# Patient Record
Sex: Male | Born: 1960 | Race: Black or African American | Hispanic: No | Marital: Single | Smoking: Never smoker
Health system: Southern US, Community
[De-identification: ages and names within clinical notes are randomized; demographics above are authoritative.]

## PROBLEM LIST (undated history)

## (undated) DIAGNOSIS — R450 Nervousness: Secondary | ICD-10-CM

## (undated) DIAGNOSIS — I10 Essential (primary) hypertension: Secondary | ICD-10-CM

## (undated) DIAGNOSIS — R569 Unspecified convulsions: Secondary | ICD-10-CM

## (undated) HISTORY — DX: Unspecified convulsions: R56.9

## (undated) HISTORY — DX: Nervousness: R45.0

## (undated) HISTORY — PX: TOE SURGERY: SHX1073

## (undated) HISTORY — DX: Essential (primary) hypertension: I10

---

## 2013-11-07 ENCOUNTER — Ambulatory Visit (INDEPENDENT_AMBULATORY_CARE_PROVIDER_SITE_OTHER): Payer: Medicare Other | Admitting: Diagnostic Neuroimaging

## 2013-11-07 ENCOUNTER — Encounter: Payer: Self-pay | Admitting: Diagnostic Neuroimaging

## 2013-11-07 VITALS — BP 100/62 | HR 69 | Ht 74.0 in | Wt 204.0 lb

## 2013-11-07 DIAGNOSIS — G40309 Generalized idiopathic epilepsy and epileptic syndromes, not intractable, without status epilepticus: Secondary | ICD-10-CM

## 2013-11-07 DIAGNOSIS — G40909 Epilepsy, unspecified, not intractable, without status epilepticus: Secondary | ICD-10-CM

## 2013-11-07 DIAGNOSIS — M542 Cervicalgia: Secondary | ICD-10-CM

## 2013-11-07 DIAGNOSIS — M5412 Radiculopathy, cervical region: Secondary | ICD-10-CM

## 2013-11-07 NOTE — Progress Notes (Signed)
[facility]  PATIENT: Frank Newton DOB: November 11, 1960  REFERRING CLINICIAN: Williams  HISTORY FROM: patient  REASON FOR VISIT: new consult   HISTORICAL  CHIEF COMPLAINT:  No chief complaint on file.   HISTORY OF PRESENT ILLNESS:   53 year old right-handed male here for evaluation of seizure disorder.  Age 53 years old, patient developed staring spells. These were not officially diagnosed. Age 53 years old patient began to drink alcohol significantly. Age 53 years old patient had first episode of complete loss of consciousness. Patient was taken to the hospital and diagnosed with seizure disorder. He had MRI, CT, EEG apparently which were unremarkable. He was started on levetiracetam, but had to stop because of rash and increased seizures. Then patient started on Depakote. Then Dilantin was added. Patient continued to have intermittent staring spells at least once per week. He also continued to use alcohol, and was variably compliant with medication.  2 months ago patient had another grand mal seizure, in the setting of stopping antiseizure medication and using alcohol. Since that time patient has stopped alcohol altogether and now is taking medication regularly. No further seizures.  Significant family history seizure and his sister, another sister's son, and maternal grandfather.   Also having some neck pain, radiating to the left shoulder, for past 2 yrs.    REVIEW OF SYSTEMS: Full 14 system review of systems performed and notable only for  rash itching weight loss and anemia.   ALLERGIES: Allergies  Allergen Reactions  . Hydrochlorothiazide   . Keppra [Levetiracetam]   . Zofran [Ondansetron Hcl]     HOME MEDICATIONS: No outpatient prescriptions prior to visit.   No facility-administered medications prior to visit.    PAST MEDICAL HISTORY: Past Medical History  Diagnosis Date  . Seizure   . Hypertension     PAST SURGICAL HISTORY: Past Surgical  History  Procedure Laterality Date  . Toe surgery      3 toes removed    FAMILY HISTORY: Family History  Problem Relation Age of Onset  . Cancer Father     SOCIAL HISTORY:  History   Social History  . Marital Status: Single    Spouse Name: N/A    Number of Children: N/A  . Years of Education: College   Occupational History  .  Other    disability   Social History Main Topics  . Smoking status: Never Smoker   . Smokeless tobacco: Never Used  . Alcohol Use: No     Comment: 09/22/13  . Drug Use: No  . Sexual Activity: Not on file   Other Topics Concern  . Not on file   Social History Narrative   Patient lives at home with friends.   Caffeine Use: sometimes     PHYSICAL EXAM  Filed Vitals:   11/07/13 1427  BP: 100/62  Pulse: 69  Height:  (1.88 m)  Weight: 204 lb (92.534 kg)    Not recorded    Body mass index is 26.18 kg/(m^2).  GENERAL EXAM: Patient is in no distress; well developed, nourished and groomed; neck is supple; HIVES ON RIGHT FACE, LEFT NECK, HANDS, ARMS  CARDIOVASCULAR: Regular rate and rhythm, no murmurs, no carotid bruits  NEUROLOGIC: MENTAL STATUS: awake, alert, oriented to person, place and time, recent and remote memory intact, normal attention and concentration, language fluent, comprehension intact, naming intact, fund of knowledge appropriate CRANIAL NERVE: no papilledema on fundoscopic exam, pupils equal and reactive to light, visual fields full to confrontation,  extraocular muscles intact, no nystagmus, facial sensation and strength symmetric, hearing intact, palate elevates symmetrically, uvula midline, shoulder shrug symmetric, tongue midline. MOTOR: normal bulk and tone, full strength in the BUE, BLE SENSORY: normal and symmetric to light touch, pinprick, temperature, vibration COORDINATION: finger-nose-finger, fine finger movements normal REFLEXES: deep tendon reflexes present and symmetric GAIT/STATION: narrow based  gait; able to walk on toes, heels and tandem; romberg is negative    DIAGNOSTIC DATA (LABS, IMAGING, TESTING) - I reviewed patient records, labs, notes, testing and imaging myself where available.  No results found for this basename: WBC, HGB, HCT, MCV, PLT   No results found for this basename: na, k, cl, co2, glucose, bun, creatinine, calcium, prot, albumin, ast, alt, alkphos, bilitot, gfrnonaa, gfraa   No results found for this basename: CHOL, HDL, LDLCALC, LDLDIRECT, TRIG, CHOLHDL   No results found for this basename: HGBA1C   No results found for this basename: VITAMINB12   No results found for this basename: TSH      ASSESSMENT AND PLAN  53 y.o. 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 Now stable on VPA  BID+ DPH  daily.  PLAN: - MRI cervical spine for neck pain eval - continue depakote and dilantin - repeat labs in 6 months    Orders Placed This Encounter  Procedures  . MR Cervical Spine Wo Contrast   Return in about 6 months (around 05/08/2014).    Suanne Marker, MD 11/07/2013, 3:22 PM Certified in Neurology, Neurophysiology and Neuroimaging  [facility] [address] [place], [place] [address] [phone]

## 2013-11-07 NOTE — Patient Instructions (Signed)
Continue current medications. 

## 2013-12-07 ENCOUNTER — Ambulatory Visit
Admission: RE | Admit: 2013-12-07 | Discharge: 2013-12-07 | Disposition: A | Discharge status: Home / Self Care | Payer: Medicaid Other | Source: Ambulatory Visit | Attending: Diagnostic Neuroimaging | Admitting: Diagnostic Neuroimaging

## 2013-12-07 DIAGNOSIS — M5412 Radiculopathy, cervical region: Secondary | ICD-10-CM

## 2013-12-07 DIAGNOSIS — M542 Cervicalgia: Secondary | ICD-10-CM

## 2013-12-13 ENCOUNTER — Telehealth: Payer: Self-pay | Admitting: Diagnostic Neuroimaging

## 2013-12-13 NOTE — Telephone Encounter (Signed)
Patient calling to request MRI results from 12/07/13, please return call and advise.

## 2013-12-15 NOTE — Telephone Encounter (Signed)
Please see phone note  

## 2013-12-15 NOTE — Telephone Encounter (Signed)
I called patient. Gave results to voicemail. He may call us on Monday for more information or come into clinic for revisit sooner to discuss. -VRP

## 2013-12-19 ENCOUNTER — Encounter: Payer: Self-pay | Admitting: Diagnostic Neuroimaging

## 2013-12-19 ENCOUNTER — Ambulatory Visit (INDEPENDENT_AMBULATORY_CARE_PROVIDER_SITE_OTHER): Payer: Medicare Other | Admitting: Diagnostic Neuroimaging

## 2013-12-19 VITALS — BP 103/73 | HR 73 | Temp 97.2°F | Ht 74.0 in | Wt 203.0 lb

## 2013-12-19 DIAGNOSIS — G40309 Generalized idiopathic epilepsy and epileptic syndromes, not intractable, without status epilepticus: Secondary | ICD-10-CM

## 2013-12-19 DIAGNOSIS — M542 Cervicalgia: Secondary | ICD-10-CM

## 2013-12-19 DIAGNOSIS — M5412 Radiculopathy, cervical region: Secondary | ICD-10-CM

## 2013-12-19 NOTE — Progress Notes (Signed)
[facility]  PATIENT: Frank Newton DOB: May 03, 1960  REFERRING CLINICIAN: Williams  HISTORY FROM: patient  REASON FOR VISIT: new consult   HISTORICAL  CHIEF COMPLAINT:  Chief Complaint  Patient presents with  . Follow-up    MRI results    HISTORY OF PRESENT ILLNESS:   UPDATE 12/19/13: was doing well until 2 weeks ago: had seizure at church bibl;e study. Smelled "donuts" that were not there, then asked to be excused. Had generalized convulsions. No tongue biting or incontinence. No missed meds. Slept for next 2 days. Now back to baseline.   PRIOR HPI (11/07/13): 53 year old right-handed male here for evaluation of seizure disorder. Age 53 years old, patient developed staring spells. These were not officially diagnosed. Age 53 years old patient began to drink alcohol significantly. Age 53 years old patient had first episode of complete loss of consciousness. Patient was taken to the hospital and diagnosed with seizure disorder. He had MRI, CT, EEG apparently which were unremarkable. He was started on levetiracetam, but had to stop because of rash and increased seizures. Then patient started on Depakote. Then Dilantin was added. Patient continued to have intermittent staring spells at least once per week. He also continued to use alcohol, and was variably compliant with medication. 2 months ago patient had another grand mal seizure, in the setting of stopping antiseizure medication and using alcohol. Since that time patient has stopped alcohol altogether and now is taking medication regularly. No further seizures. Significant family history seizure and his sister, another sister's son, and maternal grandfather. Also having some neck pain, radiating to the left shoulder, for past 2 yrs.    REVIEW OF SYSTEMS: Full 14 system review of systems performed and notable only for seizure.  ALLERGIES: Allergies  Allergen Reactions  . Hydrochlorothiazide   . Keppra [Levetiracetam]    . Zofran [Ondansetron Hcl]     HOME MEDICATIONS: Outpatient Prescriptions Prior to Visit  Medication Sig Dispense Refill  . amLODipine (NORVASC) 10 MG tablet Take 10 mg by mouth daily.    Marland Kitchen. aspirin 81 MG tablet Take 81 mg by mouth daily.    Marland Kitchen. atorvastatin (LIPITOR) 80 MG tablet Take 80 mg by mouth daily.    . divalproex (DEPAKOTE) 500 MG DR tablet Take 1,000 mg by mouth 2 (two) times daily.    . Multiple Vitamins-Minerals (MULTIVITAMIN WITH MINERALS) tablet Take 1 tablet by mouth daily.    . phenytoin (DILANTIN) 100 MG ER capsule Take 100 mg by mouth 4 (four) times daily.    Marland Kitchen. LISINOPRIL PO Take 1 tablet by mouth daily.     No facility-administered medications prior to visit.    PAST MEDICAL HISTORY: Past Medical History  Diagnosis Date  . Seizure   . Hypertension     PAST SURGICAL HISTORY: Past Surgical History  Procedure Laterality Date  . Toe surgery      3 toes removed    FAMILY HISTORY: Family History  Problem Relation Age of Onset  . Cancer Father     SOCIAL HISTORY:  History   Social History  . Marital Status: Single    Spouse Name: N/A    Number of Children: N/A  . Years of Education: College   Occupational History  .  Other    disability   Social History Main Topics  . Smoking status: Never Smoker   . Smokeless tobacco: Never Used  . Alcohol Use: No     Comment: 09/22/13  . Drug Use: No  .  Sexual Activity: Not on file   Other Topics Concern  . Not on file   Social History Narrative   Patient lives at home with friends.   Caffeine Use: sometimes     PHYSICAL EXAM  Filed Vitals:   12/19/13 1309  BP: 103/73  Pulse: 73  Temp: 97.2 F (36.2 C)  TempSrc: Oral  Height: 6\' 2"  (1.88 m)  Weight: 203 lb (92.08 kg)    Not recorded      Body mass index is 26.05 kg/(m^2).  GENERAL EXAM: Patient is in no distress; well developed, nourished and groomed; neck is supple  CARDIOVASCULAR: Regular rate and rhythm, no murmurs, no carotid  bruits  NEUROLOGIC: MENTAL STATUS: awake, alert, language fluent, comprehension intact, naming intact, fund of knowledge appropriate CRANIAL NERVE: no papilledema on fundoscopic exam, pupils equal and reactive to light, visual fields full to confrontation, extraocular muscles intact, no nystagmus, facial sensation and strength symmetric, hearing intact, palate elevates symmetrically, uvula midline, shoulder shrug symmetric, tongue midline. MOTOR: normal bulk and tone, full strength in the BUE, BLE SENSORY: normal and symmetric to light touch, pinprick, temperature, vibration COORDINATION: finger-nose-finger, fine finger movements normal REFLEXES: deep tendon reflexes present and symmetric GAIT/STATION: narrow based gait; able to walk on toes, heels and tandem; romberg is negative    DIAGNOSTIC DATA (LABS, IMAGING, TESTING) - I reviewed patient records, labs, notes, testing and imaging myself where available.  No results found for: WBC No results found for: NA No results found for: CHOL No results found for: HGBA1C No results found for: VITAMINB12 No results found for: TSH   12/07/13 MRI cervical -  1. At C4-5: uncovertebral joint hypertrophy and facet hypertrophy with severe biforaminal stenosis. 2. At C5-6: uncovertebral joint hypertrophy and facet hypertrophy with moderate right and severe left foraminal stenosis. 3. At C3-4: uncovertebral joint hypertrophy and facet hypertrophy with moderate biforaminal stenosis. 4. Multi-level degenerative changes (reversal of normal cervical curvature, spondylosis (anterior greater than posterior; with anterior bone bridging), degenerative endplate disease with marrow edema at C5-6 (on the left) and C7-T1 (on the right)).    ASSESSMENT AND PLAN  [age] y.o. 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 Was stable on VPA 1000mg  BID+ DPH 400mg  daily, but then had breakthrough seizure 2 weeks ago (no  trigger).  PLAN: - continue depakote and dilantin - repeat labs in AM; then adjust meds - no driving until seizure free x 6 months - PT for neck pain   Orders Placed This Encounter  Procedures  . Valproic acid level  . Phenytoin level, total  . CBC With differential/Platelet  . Comprehensive metabolic panel  . Ambulatory referral to Physical Therapy   Return in about 3 months (around 03/21/2014).    Suanne MarkerVIKRAM R. PENUMALLI, MD 12/19/2013, 1:46 PM Certified in Neurology, Neurophysiology and Neuroimaging  [facility] [address], [address] [place], [place] [address] [phone]

## 2013-12-19 NOTE — Patient Instructions (Signed)
Continue current medications.  I will check lab testing tomorrow morning.

## 2013-12-20 ENCOUNTER — Other Ambulatory Visit: Payer: Medicare Other

## 2013-12-20 ENCOUNTER — Telehealth: Payer: Self-pay

## 2013-12-20 NOTE — Telephone Encounter (Signed)
Patient came to the office and had his blood work done. Patient states he is getting low on his  Divalproex 500 mg twice daily and phenytoin 100 mg take four capsules by mouth at bed time.

## 2013-12-21 LAB — COMPREHENSIVE METABOLIC PANEL
A/G RATIO: 1.6 (ref 1.1–2.5)
ALBUMIN: 4.6 g/dL (ref 3.5–5.5)
ALK PHOS: 104 IU/L (ref 39–117)
ALT: 22 IU/L (ref 0–44)
AST: 21 IU/L (ref 0–40)
BUN / CREAT RATIO: 14 (ref 9–20)
BUN: 11 mg/dL (ref 6–24)
CALCIUM: 9.3 mg/dL (ref 8.7–10.2)
CO2: 25 mmol/L (ref 18–29)
CREATININE: 0.77 mg/dL (ref 0.76–1.27)
Chloride: 97 mmol/L (ref 97–108)
GFR calc Af Amer: 119 mL/min/{1.73_m2} (ref >59)
GFR, EST NON AFRICAN AMERICAN: 103 mL/min/{1.73_m2} (ref >59)
GLOBULIN, TOTAL: 2.9 g/dL (ref 1.5–4.5)
Glucose: 114 mg/dL — ABNORMAL HIGH (ref 65–99)
Potassium: 4.2 mmol/L (ref 3.5–5.2)
Sodium: 139 mmol/L (ref 134–144)
Total Bilirubin: 0.3 mg/dL (ref 0.0–1.2)
Total Protein: 7.5 g/dL (ref 6.0–8.5)

## 2013-12-21 LAB — PHENYTOIN LEVEL, TOTAL: Phenytoin Lvl: 4.8 ug/mL — ABNORMAL LOW (ref 10.0–20.0)

## 2013-12-21 LAB — CBC WITH DIFFERENTIAL
Basophils Absolute: 0 10*3/uL (ref 0.0–0.2)
Basos: 0 %
EOS: 1 %
Eosinophils Absolute: 0.1 10*3/uL (ref 0.0–0.4)
HEMATOCRIT: 42.7 % (ref 37.5–51.0)
HEMOGLOBIN: 14.2 g/dL (ref 12.6–17.7)
Immature Grans (Abs): 0 10*3/uL (ref 0.0–0.1)
Immature Granulocytes: 0 %
Lymphocytes Absolute: 2.1 10*3/uL (ref 0.7–3.1)
Lymphs: 39 %
MCH: 27.8 pg (ref 26.6–33.0)
MCHC: 33.3 g/dL (ref 31.5–35.7)
MCV: 84 fL (ref 79–97)
MONOCYTES: 6 %
Monocytes Absolute: 0.3 10*3/uL (ref 0.1–0.9)
Neutrophils Absolute: 2.8 10*3/uL (ref 1.4–7.0)
Neutrophils Relative %: 54 %
Platelets: 246 10*3/uL (ref 150–379)
RBC: 5.11 x10E6/uL (ref 4.14–5.80)
RDW: 13.8 % (ref 12.3–15.4)
WBC: 5.3 10*3/uL (ref 3.4–10.8)

## 2013-12-21 LAB — VALPROIC ACID LEVEL: Valproic Acid Lvl: 53 ug/mL (ref 50–100)

## 2013-12-22 MED ORDER — PHENYTOIN SODIUM EXTENDED 100 MG PO CAPS: 100.0000 mg | ORAL_CAPSULE | Freq: Four times a day (QID) | ORAL | Status: DC

## 2013-12-22 MED ORDER — DIVALPROEX SODIUM 500 MG PO DR TAB: 1500.0000 mg | DELAYED_RELEASE_TABLET | Freq: Two times a day (BID) | ORAL | Status: DC

## 2013-12-22 NOTE — Telephone Encounter (Signed)
Labs reviewed. VPA 53. DPH 4.8. Will increase depakote to 1500mg  BID. Keep dilantin the same (400mg  at night). Then repeat labs in 1 month.  I called patient and left message. Please call patient and let him know plan on Monday.   -VRP

## 2013-12-25 NOTE — Telephone Encounter (Signed)
Called patient. Patient said he did get Dr. Richrd HumblesPenumalli's message. We went over the medication plan again. Patient verified understanding.

## 2013-12-27 ENCOUNTER — Telehealth: Payer: Self-pay | Admitting: Diagnostic Neuroimaging

## 2013-12-27 ENCOUNTER — Ambulatory Visit: Payer: Medicaid Other

## 2013-12-27 NOTE — Telephone Encounter (Signed)
I called the patient. The patient began havingdizziness and nausea on a higher dose of the Depakote. He was on 1000 mg twice daily, apparently tolerating this well. I have asked him to go down to taking one thousand milligrams in the morning and 1500 mg in the evening for 2 weeks, then go to 1500 mg twice daily.

## 2013-12-27 NOTE — Telephone Encounter (Signed)
I called back.  Patient says Dr Marjory LiesPenumalli asked him to increase his Depakote dose on Friday to 1500mg  twice daily.  (Previously took 1000mg  twice daily) Said since he started this dose he has been lightheaded and vomiting.  He would like to know if the dose should be decreased, or if something else could be recommended.  Dr Marjory LiesPenumalli is out of the office.  [facility] for [facility].

## 2013-12-27 NOTE — Telephone Encounter (Signed)
Patient calling to ask some questions regarding his Depakote medication, please return call and advise.

## 2014-01-09 ENCOUNTER — Ambulatory Visit: Payer: Medicaid Other | Admitting: Physical Therapy

## 2014-01-25 ENCOUNTER — Ambulatory Visit: Payer: Medicaid Other

## 2014-02-01 ENCOUNTER — Ambulatory Visit: Payer: Medicare Other | Attending: Neurology

## 2014-02-01 DIAGNOSIS — M542 Cervicalgia: Secondary | ICD-10-CM | POA: Insufficient documentation

## 2014-02-01 NOTE — Patient Instructions (Signed)
  Flexibility: Upper Trapezius Stretch DO OPPOSITE OF PICTURE ( STRETCHING L SIDE)   Gently grasp left side of head while reaching behind back with other hand. Tilt head away until a gentle stretch is felt. Hold __30__ seconds. Repeat __3__ times per set.  Do __3_ sessions per day.  http://orth.exer.us/340   Levator Stretch   Grasp seat or sit on hand on side to be stretched. Turn head toward other side and look down. Use hand on head to gently stretch neck in that position. Hold __30__ seconds. Repeat on other side. Repeat __3__ times. Do __3__ sessions per day.  http://gt2.exer.us/30   Scapular Retraction (Standing)   With arms at sides, pinch shoulder blades together. Repeat __10__ times per set. Hold 5 secs  Do __2__ sets per session. Do __3__ sessions per day.  http://orth.exer.us/944    Posture - Sitting   Sit upright, head facing forward. Try using a roll to support lower back. Keep shoulders relaxed, and avoid rounded back. Keep hips level with knees. Avoid crossing legs for long periods.

## 2014-02-01 NOTE — Therapy (Addendum)
[facility] [address] [place], [place], [address] Phone: [phone]   Fax:  [phone]  Physical Therapy Evaluation  Patient Details  Name: Frank Newton MRN: [id number] Date of Birth: 1960-11-08  Encounter Date: 02/01/2014      PT End of Session - 02/01/14 1259    Visit Number 1   Number of Visits 1   Date for PT Re-Evaluation 02/02/14   PT Start Time 1145   PT Stop Time 1230   PT Time Calculation (min) 45 min   Activity Tolerance Patient tolerated treatment well  Pain decreased from 4/10 to 1/10 after treatment      Past Medical History  Diagnosis Date  . Seizure   . Hypertension     Past Surgical History  Procedure Laterality Date  . Toe surgery      3 toes removed    There were no vitals taken for this visit.  Visit Diagnosis:  Neck pain - Plan: PT plan of care cert/re-cert      Subjective Assessment - 02/01/14 1152    Symptoms Pt c/o of neck pain that began indisiously about 1 year ago L sided.    Pertinent History HTN, seizures   Limitations Reading  praying, sleeping on L side    How long can you sit comfortably? 5 mins    How long can you stand comfortably? unlimited    How long can you walk comfortably? unlimited    Diagnostic tests MRI: joint hypertrophy and facet hypertrophy with severe biforaminal stenosis   Patient Stated Goals Sit comfortably and read and pray at church    Currently in Pain? Yes   Pain Score 4    Pain Location Neck   Pain Orientation Left   Pain Descriptors / Indicators Aching;Numbness   Pain Type Chronic pain   Pain Onset More than a month ago   Pain Frequency Constant   Aggravating Factors  prolonged positions   Pain Relieving Factors movement, stretches.           [facility] Assessment - 02/01/14 1157    Assessment   Medical Diagnosis cervical stenosis   Onset Date 02/01/13   Next MD Visit 1 month    Precautions   Precautions None   Restrictions   Weight Bearing  Restrictions No   Balance Screen   Has the patient fallen in the past 6 months Yes   How many times? 1   Home Environment   Living Enviornment Private residence   Home Access Stairs to enter   Home Layout One level   Prior Function   Level of Independence Independent with basic ADLs   Observation/Other Assessments   Observations TPs at L cervical paraspinals    AROM   Cervical Flexion 30   Cervical Extension 50   Cervical - Right Side Bend 45   Cervical - Left Side Bend 40   Cervical - Right Rotation 70   Cervical - Left Rotation 60   Special Tests    Special Tests Cervical   Cervical Tests Spurling's;Dictraction   Spurling's   Findings Positive   Side Left   Distraction Test   Findngs Positive   Comment Decreased pain          [facility] Adult PT Treatment/Exercise - 02/01/14 1311    Exercises   Exercises Neck   Neck Exercises: Stretches   Upper Trapezius Stretch 3 reps;30 seconds   Levator Stretch 3 reps;30 seconds   Neck Exercises: Supine  Other Supine Exercise retractions 10 x 5 sec hold   Manual Therapy   Manual Therapy Massage   Massage STM to L cervical paraspinals and gentle distraction. with decreased pain from 4/10 to 1/10 after treatment.           PT Education - 02/01/14 1206    Education provided Yes   Education Details HEP, [facility], postural training   Person(s) Educated Patient   Methods Explanation;Demonstration;Tactile cues;Verbal cues;Handout   Comprehension Returned demonstration;Verbal cues required;Verbalized understanding          PT Short Term Goals - 02/01/14 1307    PT SHORT TERM GOAL #1   Title Establish HEP    Time 1   Period Days   Status Achieved   PT SHORT TERM GOAL #2   Title Decrease pain by 3 grades to 1/10    Time 1   Period Days   Status Achieved            Plan - 02/01/14 1259    Clinical Impression Statement Pt presents with signs and symptoms compatible with cervical pain and radiculopathy due to  stenosis of cervical spine. MRI shows joint hypertrophy and facet hypertrophy with severe biforaminal stenosis. Pt would benefit from Physical therapy, but is limited to only 1 visit approval through Medicaid; therefore, PT gave pt info on [facility] for free treatments. Pt is only seen for 1 visit of PT eval and HEP established.     Pt will benefit from skilled therapeutic intervention in order to improve on the following deficits Decreased range of motion;Decreased mobility;Decreased strength;Postural dysfunction;Pain   Rehab Potential Excellent   Clinical Impairments Affecting Rehab Potential only 1 visit approved for Medicaid   PT Frequency 1x / week   PT Duration --  only 1 visit   PT Treatment/Interventions ADLs/Self Care Home Management;Therapeutic exercise;Patient/family education;Manual techniques   PT Next Visit Plan Eval only visit   Consulted and Agree with Plan of Care Patient        G-codes Only: Other Current: CI Goal: CI DC: CI  Percell Bostonana Nicoletta, PT, DPT 05/23/2014 9:33 AM Phone: [phone] Fax: [phone]                      Problem List There are no active problems to display for this patient.   Haze RushingJessica Talar Fraley, PT, DPT 02/01/2014 1:17 PM Phone: [phone] Fax: [phone]

## 2014-03-20 ENCOUNTER — Encounter: Payer: Self-pay | Admitting: Diagnostic Neuroimaging

## 2014-03-20 ENCOUNTER — Ambulatory Visit (INDEPENDENT_AMBULATORY_CARE_PROVIDER_SITE_OTHER): Payer: Medicare Other | Admitting: Diagnostic Neuroimaging

## 2014-03-20 VITALS — BP 124/79 | HR 84 | Ht 74.5 in | Wt 207.0 lb

## 2014-03-20 DIAGNOSIS — G40909 Epilepsy, unspecified, not intractable, without status epilepticus: Secondary | ICD-10-CM

## 2014-03-20 NOTE — Progress Notes (Signed)
[facility]  PATIENT: Frank Newton DOB: [birth year]  REFERRING CLINICIAN: Williams  HISTORY FROM: patient  REASON FOR VISIT: follow up   HISTORICAL  CHIEF COMPLAINT:  Chief Complaint  Patient presents with  . Follow-up    generalized idiopathic epilepsy    HISTORY OF PRESENT ILLNESS:   UPDATE 03/20/14: Since last visit, was doing well until he had a seizure last Thursday. Also, had some trouble going up on VPA dose from 1000mg  BID to 1500mg  BID; trhen transitioned with 1000 /1500 for 1-2 weeks, now up to full dose. Still with intermittent lightheadedness.  UPDATE 12/19/13: was doing well until 2 weeks ago: had seizure at church bible study. Smelled "donuts" that were not there, then asked to be excused. Had generalized convulsions. No tongue biting or incontinence. No missed meds. Slept for next 2 days. Now back to baseline.   PRIOR HPI (11/07/13): 54 year old right-handed male here for evaluation of seizure disorder. Age 54 years old, patient developed staring spells. These were not officially diagnosed. Age 54 years old patient began to drink alcohol significantly. Age 54 years old patient had first episode of complete loss of consciousness. Patient was taken to the hospital and diagnosed with seizure disorder. He had MRI, CT, EEG apparently which were unremarkable. He was started on levetiracetam, but had to stop because of rash and increased seizures. Then patient started on Depakote. Then Dilantin was added. Patient continued to have intermittent staring spells at least once per week. He also continued to use alcohol, and was variably compliant with medication. 2 months ago patient had another grand mal seizure, in the setting of stopping antiseizure medication and using alcohol. Since that time patient has stopped alcohol altogether and now is taking medication regularly. No further seizures. Significant family history seizure and his sister, another sister's son, and  maternal grandfather. Also having some neck pain, radiating to the left shoulder, for past 2 yrs.    REVIEW OF SYSTEMS: Full 14 system review of systems performed and notable only for seizure.  ALLERGIES: Allergies  Allergen Reactions  . Hydrochlorothiazide   . Keppra [Levetiracetam]   . Zofran [Ondansetron Hcl]     HOME MEDICATIONS: Outpatient Prescriptions Prior to Visit  Medication Sig Dispense Refill  . amLODipine (NORVASC) 10 MG tablet Take 10 mg by mouth daily.    Marland Kitchen. aspirin 81 MG tablet Take 81 mg by mouth daily.    Marland Kitchen. atorvastatin (LIPITOR) 80 MG tablet Take 80 mg by mouth daily.    . divalproex (DEPAKOTE) 500 MG DR tablet Take 3 tablets (1,500 mg total) by mouth 2 (two) times daily. 180 tablet 12  . Multiple Vitamins-Minerals (MULTIVITAMIN WITH MINERALS) tablet Take 1 tablet by mouth daily.    . phenytoin (DILANTIN) 100 MG ER capsule Take 1 capsule (100 mg total) by mouth 4 (four) times daily. 120 capsule 12  . triamcinolone cream (KENALOG) 0.1 % Apply 1 application topically as needed.  3  . lisinopril (PRINIVIL,ZESTRIL) 10 MG tablet Take 1 tablet by mouth daily.  0   No facility-administered medications prior to visit.    PAST MEDICAL HISTORY: Past Medical History  Diagnosis Date  . Seizure   . Hypertension     PAST SURGICAL HISTORY: Past Surgical History  Procedure Laterality Date  . Toe surgery      3 toes removed    FAMILY HISTORY: Family History  Problem Relation Age of Onset  . Cancer Father     SOCIAL HISTORY:  History  Social History  . Marital Status: Single    Spouse Name: N/A    Number of Children: N/A  . Years of Education: College   Occupational History  .  Other    disability   Social History Main Topics  . Smoking status: Never Smoker   . Smokeless tobacco: Never Used  . Alcohol Use: No     Comment: 09/22/13  . Drug Use: No  . Sexual Activity: Not on file   Other Topics Concern  . Not on file   Social History Narrative     Patient lives at home with friends.   Caffeine Use: sometimes     PHYSICAL EXAM  Filed Vitals:   03/20/14 1334  BP: 124/79  Pulse: 84  Height: 6' 2.5" (1.892 m)  Weight: 207 lb (93.895 kg)    Not recorded      Body mass index is 26.23 kg/(m^2).  GENERAL EXAM: Patient is in no distress; well developed, nourished and groomed; neck is supple; TRIGGER FINGER IN RIGHT THUMB; LEFT UPPER LIP DEFORMITY FROM PRIOR INJURY.  CARDIOVASCULAR: Regular rate and rhythm, no murmurs, no carotid bruits  NEUROLOGIC: MENTAL STATUS: awake, alert, language fluent, comprehension intact, naming intact, fund of knowledge appropriate CRANIAL NERVE: no papilledema on fundoscopic exam, pupils equal and reactive to light, visual fields full to confrontation, extraocular muscles intact, no nystagmus, facial sensation and strength symmetric, hearing intact, palate elevates symmetrically, uvula midline, shoulder shrug symmetric, tongue midline. MOTOR: normal bulk and tone, full strength in the BUE, BLE SENSORY: normal and symmetric to light touch COORDINATION: finger-nose-finger, fine finger movements normal REFLEXES: deep tendon reflexes present and symmetric GAIT/STATION: narrow based gait; romberg is negative    DIAGNOSTIC DATA (LABS, IMAGING, TESTING) - I reviewed patient records, labs, notes, testing and imaging myself where available.  Lab Results  Component Value Date   WBC 5.3 12/20/2013      Component Value Date/Time   NA 139 12/20/2013 1058   No results found for: CHOL No results found for: HGBA1C No results found for: VITAMINB12 No results found for: TSH   12/07/13 MRI cervical -  1. At C4-5: uncovertebral joint hypertrophy and facet hypertrophy with severe biforaminal stenosis. 2. At C5-6: uncovertebral joint hypertrophy and facet hypertrophy with moderate right and severe left foraminal stenosis. 3. At C3-4: uncovertebral joint hypertrophy and facet hypertrophy with moderate  biforaminal stenosis. 4. Multi-level degenerative changes (reversal of normal cervical curvature, spondylosis (anterior greater than posterior; with anterior bone bridging), degenerative endplate disease with marrow edema at C5-6 (on the left) and C7-T1 (on the right)).    ASSESSMENT AND PLAN  53 y.o. 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 Last seizure on 03/15/14.   PLAN: - continue depakote and dilantin - repeat labs in AM; then adjust meds - no driving until seizure free x 6 months - PT for neck pain   Orders Placed This Encounter  Procedures  . Valproic acid level  . Phenytoin level, total   Return in about 3 months (around 06/18/2014).    Suanne Marker, MD 03/20/2014, 2:08 PM Certified in Neurology, Neurophysiology and Neuroimaging  [facility] [address], [address] [place], [place] [address] [phone]

## 2014-03-20 NOTE — Patient Instructions (Signed)
Come back later this week in the morning (8:30am) to have depakote and dilantin trough levels done (before morning dose of meds).

## 2014-03-23 ENCOUNTER — Other Ambulatory Visit: Payer: Self-pay | Admitting: Diagnostic Neuroimaging

## 2014-03-23 ENCOUNTER — Other Ambulatory Visit: Payer: Self-pay | Admitting: *Deleted

## 2014-03-23 ENCOUNTER — Other Ambulatory Visit (INDEPENDENT_AMBULATORY_CARE_PROVIDER_SITE_OTHER): Payer: Self-pay

## 2014-03-23 DIAGNOSIS — G40909 Epilepsy, unspecified, not intractable, without status epilepticus: Secondary | ICD-10-CM

## 2014-03-23 DIAGNOSIS — Z0289 Encounter for other administrative examinations: Secondary | ICD-10-CM

## 2014-03-23 DIAGNOSIS — R569 Unspecified convulsions: Secondary | ICD-10-CM

## 2014-03-24 LAB — VALPROIC ACID LEVEL: Valproic Acid Lvl: 57 ug/mL (ref 50–100)

## 2014-03-24 LAB — PHENYTOIN LEVEL, TOTAL: PHENYTOIN LVL: 7 ug/mL — AB (ref 10.0–20.0)

## 2014-04-30 ENCOUNTER — Telehealth: Payer: Self-pay | Admitting: Diagnostic Neuroimaging

## 2014-04-30 NOTE — Telephone Encounter (Signed)
Patient returning someone's call regarding BW results.  Please call and advise.

## 2014-05-01 NOTE — Telephone Encounter (Signed)
Pt calling back stating he never got the results from labs drawn in February.  Please call back with results.

## 2014-05-08 ENCOUNTER — Ambulatory Visit: Payer: Medicare Other | Admitting: Diagnostic Neuroimaging

## 2014-05-15 ENCOUNTER — Other Ambulatory Visit: Payer: Self-pay

## 2014-05-15 ENCOUNTER — Other Ambulatory Visit (INDEPENDENT_AMBULATORY_CARE_PROVIDER_SITE_OTHER): Payer: Self-pay

## 2014-05-15 DIAGNOSIS — Z0289 Encounter for other administrative examinations: Secondary | ICD-10-CM

## 2014-05-15 DIAGNOSIS — R569 Unspecified convulsions: Secondary | ICD-10-CM

## 2014-05-16 LAB — PHENYTOIN LEVEL, TOTAL: PHENYTOIN LVL: 10.9 ug/mL (ref 10.0–20.0)

## 2014-06-15 ENCOUNTER — Telehealth: Payer: Self-pay | Admitting: Diagnostic Neuroimaging

## 2014-06-15 MED ORDER — PHENYTOIN SODIUM EXTENDED 100 MG PO CAPS: ORAL_CAPSULE | ORAL | Status: DC

## 2014-06-15 NOTE — Telephone Encounter (Signed)
Per notes on lab results:  Notes Recorded by Elise BenneSamantha Allen, RN on 05/01/2014 at 1:01 PM Spoke with the pt and informed him of his labwork, per Dr. Richrd HumblesPenumalli's request, asked the pt to increase his dilantin to 500 mg daily and repeat blood work in 2 weeks. He stated an understanding  Rx has been sent

## 2014-06-15 NOTE — Telephone Encounter (Signed)
Patient is calling to get a new Rx called in for Dilantin 100mg . Patient now takes 5 pills at night. Patient has enough medication only for tonight. [facility] [address]. Thank you.

## 2014-06-18 ENCOUNTER — Ambulatory Visit: Payer: Medicare Other | Admitting: Diagnostic Neuroimaging

## 2014-06-29 ENCOUNTER — Ambulatory Visit (INDEPENDENT_AMBULATORY_CARE_PROVIDER_SITE_OTHER): Payer: Medicare Other | Admitting: Diagnostic Neuroimaging

## 2014-06-29 ENCOUNTER — Encounter: Payer: Self-pay | Admitting: Diagnostic Neuroimaging

## 2014-06-29 VITALS — BP 122/78 | HR 65 | Ht 74.5 in | Wt 207.0 lb

## 2014-06-29 DIAGNOSIS — G40309 Generalized idiopathic epilepsy and epileptic syndromes, not intractable, without status epilepticus: Secondary | ICD-10-CM | POA: Diagnosis not present

## 2014-06-29 NOTE — Patient Instructions (Signed)
Come back next week for early morning labs, before taking your seizure medications.

## 2014-06-29 NOTE — Progress Notes (Signed)
[facility]  PATIENT: Frank Newton DOB: 1960-07-22  REFERRING CLINICIAN: Williams  HISTORY FROM: patient  REASON FOR VISIT: follow up   HISTORICAL  CHIEF COMPLAINT:  Chief Complaint  Patient presents with  . Follow-up    seizure disorder     HISTORY OF PRESENT ILLNESS:   UPDATE 06/29/14: Since last visit, doing about the same, except had another sz in March 2016, at church. Also with intermittent los of muscle tone while holding bible or other objects.   UPDATE 03/20/14: Since last visit, was doing well until he had a seizure last Thursday. Also, had some trouble going up on VPA dose from 1000mg  BID to 1500mg  BID; trhen transitioned with 1000 /1500 for 1-2 weeks, now up to full dose. Still with intermittent lightheadedness.  UPDATE 12/19/13: was doing well until 2 weeks ago: had seizure at church bible study. Smelled "donuts" that were not there, then asked to be excused. Had generalized convulsions. No tongue biting or incontinence. No missed meds. Slept for next 2 days. Now back to baseline.   PRIOR HPI (11/07/13): 54 year old right-handed male here for evaluation of seizure disorder. Age 54 years old, patient developed staring spells. These were not officially diagnosed. Age 54 years old patient began to drink alcohol significantly. Age 54 years old patient had first episode of complete loss of consciousness. Patient was taken to the hospital and diagnosed with seizure disorder. He had MRI, CT, EEG apparently which were unremarkable. He was started on levetiracetam, but had to stop because of rash and increased seizures. Then patient started on Depakote. Then Dilantin was added. Patient continued to have intermittent staring spells at least once per week. He also continued to use alcohol, and was variably compliant with medication. 2 months ago patient had another grand mal seizure, in the setting of stopping antiseizure medication and using alcohol. Since that time  patient has stopped alcohol altogether and now is taking medication regularly. No further seizures. Significant family history seizure and his sister, another sister's son, and maternal grandfather. Also having some neck pain, radiating to the left shoulder, for past 2 yrs.    REVIEW OF SYSTEMS: Full 14 system review of systems performed and notable only for seizure.  ALLERGIES: Allergies  Allergen Reactions  . Hydrochlorothiazide   . Keppra [Levetiracetam]   . Zofran [Ondansetron Hcl]     HOME MEDICATIONS: Outpatient Prescriptions Prior to Visit  Medication Sig Dispense Refill  . amLODipine (NORVASC) 10 MG tablet Take 10 mg by mouth daily.    Marland Kitchen. aspirin 81 MG tablet Take 81 mg by mouth daily.    Marland Kitchen. atorvastatin (LIPITOR) 80 MG tablet Take 80 mg by mouth daily.    . divalproex (DEPAKOTE) 500 MG DR tablet Take 3 tablets (1,500 mg total) by mouth 2 (two) times daily. 180 tablet 12  . Multiple Vitamins-Minerals (MULTIVITAMIN WITH MINERALS) tablet Take 1 tablet by mouth daily.    . phenytoin (DILANTIN) 100 MG ER capsule Take a total of 500mg  po daily as directed 150 capsule 6  . triamcinolone cream (KENALOG) 0.1 % Apply 1 application topically as needed.  3   No facility-administered medications prior to visit.    PAST MEDICAL HISTORY: Past Medical History  Diagnosis Date  . Seizure   . Hypertension     PAST SURGICAL HISTORY: Past Surgical History  Procedure Laterality Date  . Toe surgery      3 toes removed    FAMILY HISTORY: Family History  Problem Relation Age  of Onset  . Cancer Father     SOCIAL HISTORY:  History   Social History  . Marital Status: Single    Spouse Name: N/A  . Number of Children: N/A  . Years of Education: College   Occupational History  .  Other    disability   Social History Main Topics  . Smoking status: Never Smoker   . Smokeless tobacco: Never Used  . Alcohol Use: No     Comment: 09/22/13  . Drug Use: No  . Sexual Activity: Not  on file   Other Topics Concern  . Not on file   Social History Narrative   Patient lives at home with friends.   Caffeine Use: sometimes     PHYSICAL EXAM  Filed Vitals:   06/29/14 1115  BP: 122/78  Pulse: 65  Height: 6' 2.5" (1.892 m)  Weight: 207 lb (93.895 kg)    Not recorded      Body mass index is 26.23 kg/(m^2).  GENERAL EXAM: Patient is in no distress; well developed, nourished and groomed; neck is supple; TRIGGER FINGER IN RIGHT THUMB; LEFT UPPER LIP DEFORMITY FROM PRIOR INJURY.  CARDIOVASCULAR: Regular rate and rhythm, no murmurs, no carotid bruits  NEUROLOGIC: MENTAL STATUS: awake, alert, language fluent, comprehension intact, naming intact, fund of knowledge appropriate CRANIAL NERVE: pupils equal and reactive to light, visual fields full to confrontation, extraocular muscles intact, no nystagmus, facial sensation and strength symmetric, hearing intact, palate elevates symmetrically, uvula midline, shoulder shrug symmetric, tongue midline. MOTOR: normal bulk and tone, full strength in the BUE, BLE; RARE ASTERIXIS WHILE HOLDING A MAGAZINE TO READ SENSORY: normal and symmetric to light touch COORDINATION: finger-nose-finger, fine finger movements normal REFLEXES: deep tendon reflexes present and symmetric GAIT/STATION: narrow based gait; romberg is negative    DIAGNOSTIC DATA (LABS, IMAGING, TESTING) - I reviewed patient records, labs, notes, testing and imaging myself where available.  Lab Results  Component Value Date   WBC 5.3 12/20/2013      Component Value Date/Time   NA 139 12/20/2013 1058   No results found for: CHOL No results found for: HGBA1C No results found for: VITAMINB12 No results found for: TSH   12/07/13 MRI cervical -  1. At C4-5: uncovertebral joint hypertrophy and facet hypertrophy with severe biforaminal stenosis. 2. At C5-6: uncovertebral joint hypertrophy and facet hypertrophy with moderate right and severe left foraminal  stenosis. 3. At C3-4: uncovertebral joint hypertrophy and facet hypertrophy with moderate biforaminal stenosis. 4. Multi-level degenerative changes (reversal of normal cervical curvature, spondylosis (anterior greater than posterior; with anterior bone bridging), degenerative endplate disease with marrow edema at C5-6 (on the left) and C7-T1 (on the right)).    ASSESSMENT AND PLAN  53 y.o. 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 Last seizure on 04/17/14. Now with intermittent asterixis. May be side effect of depakote.  PLAN: - continue depakote and dilantin - repeat labs next week; then adjust meds; may need to lower depakote, and then add a new medication to take its place for better sz control - no driving until seizure free x 6 months; last sz March 2016  Orders Placed This Encounter  Procedures  . Valproic acid level  . Phenytoin level, total  . CBC with Differential/Platelet  . Comprehensive metabolic panel  . Ammonia   Return in about 3 months (around 09/29/2014).  I spent 15 minutes of face to face time with patient. Greater than 50% of time was spent  in counseling and coordination of care with patient.     Suanne MarkerVIKRAM R. Ellison Rieth, MD 06/29/2014, 11:47 AM Certified in Neurology, Neurophysiology and Neuroimaging  [facility] [address], [address] [place], [place] [address] [phone]

## 2014-07-05 ENCOUNTER — Other Ambulatory Visit (INDEPENDENT_AMBULATORY_CARE_PROVIDER_SITE_OTHER): Payer: Self-pay

## 2014-07-05 DIAGNOSIS — Z0289 Encounter for other administrative examinations: Secondary | ICD-10-CM

## 2014-07-05 DIAGNOSIS — G40309 Generalized idiopathic epilepsy and epileptic syndromes, not intractable, without status epilepticus: Secondary | ICD-10-CM

## 2014-07-06 LAB — CBC WITH DIFFERENTIAL/PLATELET
BASOS ABS: 0 10*3/uL (ref 0.0–0.2)
Basos: 0 %
EOS (ABSOLUTE): 0.1 10*3/uL (ref 0.0–0.4)
EOS: 2 %
Hematocrit: 40.9 % (ref 37.5–51.0)
Hemoglobin: 13.2 g/dL (ref 12.6–17.7)
IMMATURE GRANS (ABS): 0 10*3/uL (ref 0.0–0.1)
Immature Granulocytes: 0 %
LYMPHS ABS: 2.1 10*3/uL (ref 0.7–3.1)
Lymphs: 43 %
MCH: 27.7 pg (ref 26.6–33.0)
MCHC: 32.3 g/dL (ref 31.5–35.7)
MCV: 86 fL (ref 79–97)
MONOCYTES: 9 %
Monocytes Absolute: 0.4 10*3/uL (ref 0.1–0.9)
NEUTROS PCT: 46 %
Neutrophils Absolute: 2.3 10*3/uL (ref 1.4–7.0)
PLATELETS: 226 10*3/uL (ref 150–379)
RBC: 4.76 x10E6/uL (ref 4.14–5.80)
RDW: 14.4 % (ref 12.3–15.4)
WBC: 4.9 10*3/uL (ref 3.4–10.8)

## 2014-07-06 LAB — PHENYTOIN LEVEL, TOTAL: PHENYTOIN LVL: 15.9 ug/mL (ref 10.0–20.0)

## 2014-07-06 LAB — COMPREHENSIVE METABOLIC PANEL
ALK PHOS: 76 IU/L (ref 39–117)
ALT: 25 IU/L (ref 0–44)
AST: 23 IU/L (ref 0–40)
Albumin/Globulin Ratio: 1.6 (ref 1.1–2.5)
Albumin: 4.2 g/dL (ref 3.5–5.5)
BUN / CREAT RATIO: 16 (ref 9–20)
BUN: 12 mg/dL (ref 6–24)
Bilirubin Total: 0.3 mg/dL (ref 0.0–1.2)
CHLORIDE: 100 mmol/L (ref 97–108)
CO2: 27 mmol/L (ref 18–29)
Calcium: 9.4 mg/dL (ref 8.7–10.2)
Creatinine, Ser: 0.77 mg/dL (ref 0.76–1.27)
GFR calc Af Amer: 118 mL/min/{1.73_m2} (ref >59)
GFR calc non Af Amer: 102 mL/min/{1.73_m2} (ref >59)
GLOBULIN, TOTAL: 2.6 g/dL (ref 1.5–4.5)
Glucose: 88 mg/dL (ref 65–99)
Potassium: 4.3 mmol/L (ref 3.5–5.2)
SODIUM: 141 mmol/L (ref 134–144)
Total Protein: 6.8 g/dL (ref 6.0–8.5)

## 2014-07-06 LAB — VALPROIC ACID LEVEL: Valproic Acid Lvl: 37 ug/mL — ABNORMAL LOW (ref 50–100)

## 2014-07-06 LAB — AMMONIA: Ammonia: 157 ug/dL (ref 27–102)

## 2014-08-03 ENCOUNTER — Telehealth: Payer: Self-pay | Admitting: *Deleted

## 2014-08-03 NOTE — Telephone Encounter (Signed)
Called and left a message for the pt asking him to call me back so we could discuss his lab results from May. I also want to set up an appt with him in 1-2 weeks so Dr. Marjory Lies can start working on changing his dosage of medication.

## 2014-08-03 NOTE — Telephone Encounter (Signed)
Pt called me back and we spoke about his lab results. He told me that he was having Nausea, lightheadedness, dizziness and passing out spells. I spoke with him about getting an appt next week and he was agreeable. We were able to schedule him on Monday 08/06/14 at 0930. He told me that he would try to get here a little early. He thanked me for calling

## 2014-08-06 ENCOUNTER — Encounter: Payer: Self-pay | Admitting: Diagnostic Neuroimaging

## 2014-08-06 ENCOUNTER — Ambulatory Visit (INDEPENDENT_AMBULATORY_CARE_PROVIDER_SITE_OTHER): Payer: Medicare Other | Admitting: Diagnostic Neuroimaging

## 2014-08-06 VITALS — BP 119/82 | HR 71 | Ht 74.5 in | Wt 206.0 lb

## 2014-08-06 DIAGNOSIS — G40309 Generalized idiopathic epilepsy and epileptic syndromes, not intractable, without status epilepticus: Secondary | ICD-10-CM

## 2014-08-06 DIAGNOSIS — E7220 Disorder of urea cycle metabolism, unspecified: Secondary | ICD-10-CM | POA: Diagnosis not present

## 2014-08-06 MED ORDER — LACOSAMIDE 50 MG PO TABS: 50.0000 mg | ORAL_TABLET | Freq: Two times a day (BID) | ORAL | Status: DC

## 2014-08-06 NOTE — Patient Instructions (Addendum)
1. Continue dilantin 500mg  at bedtime and depakote 1500mg  twice a day. 2. Start vimpat 50mg  twice a day. 3. After 2 weeks of vimpat, then reduce depakote to 1000mg  BID. 4. Return to clinic in 6 weeks for lab testing and revisit (morning appointment)

## 2014-08-06 NOTE — Progress Notes (Signed)
[facility]  PATIENT: Frank Newton DOB: 12-11-1960  REFERRING CLINICIAN: Williams  HISTORY FROM: patient  REASON FOR VISIT: follow up   HISTORICAL  CHIEF COMPLAINT:  Chief Complaint  Patient presents with  . Seizures    rm 6, "discuss new medication for sz/tremors"  . Follow-up    HISTORY OF PRESENT ILLNESS:   UPDATE 08/06/14: Since last visit, more nausea, tremors, loss of muscle tone. On divalproex  BID and dilantin  qhs. Labs reviewed, and will transition depakote to vimpat slowly.  UPDATE 06/29/14: Since last visit, doing about the same, except had another sz in March 2016, at church. Also with intermittent los of muscle tone while holding bible or other objects.   UPDATE 03/20/14: Since last visit, was doing well until he had a seizure last Thursday. Also, had some trouble going up on VPA dose from  BID to  BID; trhen transitioned with 1000 /1500 for 1-2 weeks, now up to full dose. Still with intermittent lightheadedness.  UPDATE 12/19/13: was doing well until 2 weeks ago: had seizure at church bible study. Smelled "donuts" that were not there, then asked to be excused. Had generalized convulsions. No tongue biting or incontinence. No missed meds. Slept for next 2 days. Now back to baseline.   PRIOR HPI (11/07/13): 54 year old right-handed male here for evaluation of seizure disorder. Age 54 years old, patient developed staring spells. These were not officially diagnosed. Age 54 years old patient began to drink alcohol significantly. Age 54 years old patient had first episode of complete loss of consciousness. Patient was taken to the hospital and diagnosed with seizure disorder. He had MRI, CT, EEG apparently which were unremarkable. He was started on levetiracetam, but had to stop because of rash and increased seizures. Then patient started on Depakote. Then Dilantin was added. Patient continued to have intermittent staring spells at least  once per week. He also continued to use alcohol, and was variably compliant with medication. 2 months ago patient had another grand mal seizure, in the setting of stopping antiseizure medication and using alcohol. Since that time patient has stopped alcohol altogether and now is taking medication regularly. No further seizures. Significant family history seizure and his sister, another sister's son, and maternal grandfather. Also having some neck pain, radiating to the left shoulder, for past 2 yrs.    REVIEW OF SYSTEMS: Full 14 system review of systems performed and notable only for seizure.  ALLERGIES: Allergies  Allergen Reactions  . Hydrochlorothiazide   . Keppra [Levetiracetam]   . Zofran [Ondansetron Hcl]     HOME MEDICATIONS: Outpatient Prescriptions Prior to Visit  Medication Sig Dispense Refill  . amLODipine (NORVASC) 10 MG tablet Take 10 mg by mouth daily.    Marland Kitchen aspirin 81 MG tablet Take 81 mg by mouth daily.    Marland Kitchen atorvastatin (LIPITOR) 80 MG tablet Take 80 mg by mouth daily.    . divalproex (DEPAKOTE) 500 MG DR tablet Take 3 tablets (1,500 mg total) by mouth 2 (two) times daily. 180 tablet 12  . Multiple Vitamins-Minerals (MULTIVITAMIN WITH MINERALS) tablet Take 1 tablet by mouth daily.    . phenytoin (DILANTIN) 100 MG ER capsule Take a total of  po daily as directed 150 capsule 6  . triamcinolone cream (KENALOG) 0.1 % Apply 1 application topically as needed.  3   No facility-administered medications prior to visit.    PAST MEDICAL HISTORY: Past Medical History  Diagnosis Date  . Seizure   . Hypertension   .  Nervous     PAST SURGICAL HISTORY: Past Surgical History  Procedure Laterality Date  . Toe surgery      3 toes removed    FAMILY HISTORY: Family History  Problem Relation Age of Onset  . Cancer Father     SOCIAL HISTORY:  History   Social History  . Marital Status: Single    Spouse Name: N/A  . Number of Children: N/A  . Years of Education:  College   Occupational History  .  Other    disability   Social History Main Topics  . Smoking status: Never Smoker   . Smokeless tobacco: Never Used  . Alcohol Use: No     Comment: 09/22/13  . Drug Use: No  . Sexual Activity: Not on file   Other Topics Concern  . Not on file   Social History Narrative   Patient lives at home with friends.   Caffeine Use: sometimes     PHYSICAL EXAM  Filed Vitals:   08/06/14 0912  BP: 119/82  Pulse: 71  Height: 6' 2.5" (1.892 m)  Weight: 206 lb (93.441 kg)    Not recorded      Body mass index is 26.1 kg/(m^2).  GENERAL EXAM: Patient is in no distress; well developed, nourished and groomed; neck is supple; TRIGGER FINGER IN RIGHT THUMB; LEFT UPPER LIP DEFORMITY FROM PRIOR INJURY.  CARDIOVASCULAR: Regular rate and rhythm, no murmurs, no carotid bruits  NEUROLOGIC: MENTAL STATUS: awake, alert, language fluent, comprehension intact, naming intact, fund of knowledge appropriate CRANIAL NERVE: pupils equal and reactive to light, visual fields full to confrontation, extraocular muscles intact, no nystagmus, facial sensation and strength symmetric, hearing intact, palate elevates symmetrically, uvula midline, shoulder shrug symmetric, tongue midline. MOTOR: normal bulk and tone, full strength in the BUE, BLE; RARE ASTERIXIS WHILE HOLDING A MAGAZINE TO READ SENSORY: normal and symmetric to light touch COORDINATION: finger-nose-finger, fine finger movements normal REFLEXES: deep tendon reflexes present and symmetric GAIT/STATION: narrow based gait; SLIGHTLY SLOW CAUTIOUS GAIT; romberg is negative    DIAGNOSTIC DATA (LABS, IMAGING, TESTING) - I reviewed patient records, labs, notes, testing and imaging myself where available.  CBC Latest Ref Rng 07/05/2014 12/20/2013  WBC 3.4 - 10.8 x10E3/uL 4.9 5.3  Hemoglobin 12.6 - 17.7 g/dL - 16.1  Hematocrit 09.6 - 51.0 % 40.9 42.7  Platelets 150 - 379 x10E3/uL - 246   CMP Latest Ref Rng  07/05/2014 12/20/2013  Glucose 65 - 99 mg/dL 88 045(W)  BUN 6 - 24 mg/dL 12 11  Creatinine 0.98 - 1.27 mg/dL 1.19 1.47  Sodium 829 - 144 mmol/L 141 139  Potassium 3.5 - 5.2 mmol/L 4.3 4.2  Chloride 97 - 108 mmol/L 100 97  CO2 18 - 29 mmol/L 27 25  Calcium 8.7 - 10.2 mg/dL 9.4 9.3  Total Protein 6.0 - 8.5 g/dL 6.8 7.5  Albumin 3.5 - 5.5 g/dL 4.2 4.6  Total Bilirubin 0.0 - 1.2 mg/dL 0.3 0.3  Alkaline Phos 39 - 117 IU/L 76 104  AST 0 - 40 IU/L 23 21  ALT 0 - 44 IU/L 25 22   AMMONIA  Date Value Ref Range Status  07/05/2014 157* 27 - 102 ug/dL Final   Lab Results  Component Value Date   PHENYTOIN 15.9 07/05/2014   VALPROATE 37* 07/05/2014    12/07/13 MRI cervical -  1. At C4-5: uncovertebral joint hypertrophy and facet hypertrophy with severe biforaminal stenosis. 2. At C5-6: uncovertebral joint hypertrophy and facet hypertrophy with moderate  right and severe left foraminal stenosis. 3. At C3-4: uncovertebral joint hypertrophy and facet hypertrophy with moderate biforaminal stenosis. 4. Multi-level degenerative changes (reversal of normal cervical curvature, spondylosis (anterior greater than posterior; with anterior bone bridging), degenerative endplate disease with marrow edema at C5-6 (on the left) and C7-T1 (on the right)).    ASSESSMENT AND PLAN  54 y.o. 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 Last seizure on 04/17/14. Now with intermittent asterixis. May be side effect of depakote.  PLAN:  Patient Instructions  1. Continue dilantin 500mg  at bedtime and depakote 1500mg  twice a day. 2. Start vimpat 50mg  twice a day. 3. After 2 weeks of vimpat, then reduce depakote to 1000mg  BID. 4. Return to clinic in 6 weeks for lab testing and revisit (morning appointment)     Meds ordered this encounter  Medications  . lacosamide (VIMPAT) 50 MG TABS tablet    Sig: Take 1 tablet (50 mg total) by mouth 2 (two) times daily.    Dispense:  60 tablet      Refill:  5    Return in about 6 weeks (around 09/17/2014) for AM appt and labs.  I spent 25 minutes of face to face time with patient. Greater than 50% of time was spent in counseling and coordination of care with patient.     Suanne Marker, MD 08/06/2014, 9:41 AM Certified in Neurology, Neurophysiology and Neuroimaging  [facility] [address] [place], [place] [address] [phone]

## 2014-09-14 HISTORY — PX: TRIGGER FINGER RELEASE: SHX641

## 2014-10-03 ENCOUNTER — Encounter: Payer: Self-pay | Admitting: Diagnostic Neuroimaging

## 2014-10-03 ENCOUNTER — Ambulatory Visit (INDEPENDENT_AMBULATORY_CARE_PROVIDER_SITE_OTHER): Payer: Medicare Other | Admitting: Diagnostic Neuroimaging

## 2014-10-03 VITALS — BP 114/77 | HR 67 | Ht 74.5 in | Wt 200.8 lb

## 2014-10-03 DIAGNOSIS — G40309 Generalized idiopathic epilepsy and epileptic syndromes, not intractable, without status epilepticus: Secondary | ICD-10-CM | POA: Diagnosis not present

## 2014-10-03 MED ORDER — PHENYTOIN SODIUM EXTENDED 100 MG PO CAPS: ORAL_CAPSULE | ORAL | Status: AC

## 2014-10-03 MED ORDER — LACOSAMIDE 100 MG PO TABS: 100.0000 mg | ORAL_TABLET | Freq: Two times a day (BID) | ORAL | Status: DC

## 2014-10-03 MED ORDER — DIVALPROEX SODIUM 500 MG PO DR TAB: 1000.0000 mg | DELAYED_RELEASE_TABLET | Freq: Two times a day (BID) | ORAL | Status: DC

## 2014-10-03 NOTE — Progress Notes (Signed)
[facility]  PATIENT: Frank Newton DOB: [birth year]  REFERRING CLINICIAN: Williams  HISTORY FROM: patient  REASON FOR VISIT: follow up   HISTORICAL  CHIEF COMPLAINT:  Chief Complaint  Patient presents with  . Epilepsy    rm 6, 2 sz in July"  . Follow-up    2 mos    HISTORY OF PRESENT ILLNESS:   UPDATE 10/03/14: Since last visit, nausea, tremor, asterixis have calmed down (since lowering depakote dosing). Had breakthrough sz in church on 09/09/14 (low BP, staring spell; no convulsions). After surgery on 09/14/14 for right thumb trigger finger release, had sz and fell on floor, with staring spells.  UPDATE 08/06/14: Since last visit, more nausea, tremors, loss of muscle tone. On divalproex 1500mg  BID and dilantin 500mg  qhs. Labs reviewed, and will transition depakote to vimpat slowly.  UPDATE 06/29/14: Since last visit, doing about the same, except had another sz in March 2016, at church. Also with intermittent los of muscle tone while holding bible or other objects.   UPDATE 03/20/14: Since last visit, was doing well until he had a seizure last Thursday. Also, had some trouble going up on VPA dose from 1000mg  BID to 1500mg  BID; trhen transitioned with 1000 /1500 for 1-2 weeks, now up to full dose. Still with intermittent lightheadedness.  UPDATE 12/19/13: was doing well until 2 weeks ago: had seizure at church bible study. Smelled "donuts" that were not there, then asked to be excused. Had generalized convulsions. No tongue biting or incontinence. No missed meds. Slept for next 2 days. Now back to baseline.   PRIOR HPI (11/07/13): 54 year old right-handed male here for evaluation of seizure disorder. Age 54 years old, patient developed staring spells. These were not officially diagnosed. Age 54 years old patient began to drink alcohol significantly. Age 54 years old patient had first episode of complete loss of consciousness. Patient was taken to the hospital and diagnosed  with seizure disorder. He had MRI, CT, EEG apparently which were unremarkable. He was started on levetiracetam, but had to stop because of rash and increased seizures. Then patient started on Depakote. Then Dilantin was added. Patient continued to have intermittent staring spells at least once per week. He also continued to use alcohol, and was variably compliant with medication. 2 months ago patient had another grand mal seizure, in the setting of stopping antiseizure medication and using alcohol. Since that time patient has stopped alcohol altogether and now is taking medication regularly. No further seizures. Significant family history seizure and his sister, another sister's son, and maternal grandfather. Also having some neck pain, radiating to the left shoulder, for past 2 yrs.    REVIEW OF SYSTEMS: Full 14 system review of systems performed and notable only for seizure.  ALLERGIES: Allergies  Allergen Reactions  . Hydrochlorothiazide   . Keppra [Levetiracetam]   . Zofran [Ondansetron Hcl]     HOME MEDICATIONS: Outpatient Prescriptions Prior to Visit  Medication Sig Dispense Refill  . amLODipine (NORVASC) 10 MG tablet Take 10 mg by mouth daily.    Marland Kitchen aspirin 81 MG tablet Take 81 mg by mouth daily.    Marland Kitchen atorvastatin (LIPITOR) 80 MG tablet Take 80 mg by mouth daily.    . Multiple Vitamins-Minerals (MULTIVITAMIN WITH MINERALS) tablet Take 1 tablet by mouth daily.    Marland Kitchen triamcinolone cream (KENALOG) 0.1 % Apply 1 application topically as needed.  3  . divalproex (DEPAKOTE) 500 MG DR tablet Take 3 tablets (1,500 mg total) by mouth 2 (two)  times daily. 180 tablet 12  . lacosamide (VIMPAT) 50 MG TABS tablet Take 1 tablet (50 mg total) by mouth 2 (two) times daily. 60 tablet 5  . phenytoin (DILANTIN) 100 MG ER capsule Take a total of 500mg  po daily as directed 150 capsule 6   No facility-administered medications prior to visit.    PAST MEDICAL HISTORY: Past Medical History  Diagnosis Date    . Hypertension   . Nervous   . Seizure     last sz 09/14/14    PAST SURGICAL HISTORY: Past Surgical History  Procedure Laterality Date  . Toe surgery      3 toes removed  . Trigger finger release Right 09/14/14    thumb    FAMILY HISTORY: Family History  Problem Relation Age of Onset  . Cancer Father     SOCIAL HISTORY:  Social History   Social History  . Marital Status: Single    Spouse Name: N/A  . Number of Children: N/A  . Years of Education: College   Occupational History  .  Other    disability   Social History Main Topics  . Smoking status: Never Smoker   . Smokeless tobacco: Never Used  . Alcohol Use: No     Comment: 09/22/13  . Drug Use: No  . Sexual Activity: Not on file   Other Topics Concern  . Not on file   Social History Narrative   Patient lives at home with friends.   Caffeine Use: sometimes     PHYSICAL EXAM  Filed Vitals:   10/03/14 1008  BP: 114/77  Pulse: 67  Height: 6' 2.5" (1.892 m)  Weight: 200 lb 12.8 oz (91.082 kg)    Not recorded      Body mass index is 25.44 kg/(m^2).  GENERAL EXAM: Patient is in no distress; well developed, nourished and groomed; neck is supple; TRIGGER FINGER IN RIGHT THUMB; LEFT UPPER LIP DEFORMITY FROM PRIOR INJURY.  CARDIOVASCULAR: Regular rate and rhythm, no murmurs, no carotid bruits  NEUROLOGIC: MENTAL STATUS: awake, alert, language fluent, comprehension intact, naming intact, fund of knowledge appropriate CRANIAL NERVE: pupils equal and reactive to light, visual fields full to confrontation, extraocular muscles intact, no nystagmus, facial sensation and strength symmetric, hearing intact, palate elevates symmetrically, uvula midline, shoulder shrug symmetric, tongue midline. MOTOR: normal bulk and tone, full strength in the BUE, BLE; NO TREMOR OR ASTERIXIS SENSORY: normal and symmetric to light touch COORDINATION: finger-nose-finger, fine finger movements normal REFLEXES: deep tendon  reflexes present and symmetric GAIT/STATION: narrow based gait; SLIGHTLY SLOW CAUTIOUS GAIT; romberg is negative    DIAGNOSTIC DATA (LABS, IMAGING, TESTING) - I reviewed patient records, labs, notes, testing and imaging myself where available.  CBC Latest Ref Rng 07/05/2014 12/20/2013  WBC 3.4 - 10.8 x10E3/uL 4.9 5.3  Hemoglobin 12.6 - 17.7 g/dL - 16.1  Hematocrit 09.6 - 51.0 % 40.9 42.7  Platelets 150 - 379 x10E3/uL - 246   CMP Latest Ref Rng 07/05/2014 12/20/2013  Glucose 65 - 99 mg/dL 88 045(W)  BUN 6 - 24 mg/dL 12 11  Creatinine 0.98 - 1.27 mg/dL 1.19 1.47  Sodium 829 - 144 mmol/L 141 139  Potassium 3.5 - 5.2 mmol/L 4.3 4.2  Chloride 97 - 108 mmol/L 100 97  CO2 18 - 29 mmol/L 27 25  Calcium 8.7 - 10.2 mg/dL 9.4 9.3  Total Protein 6.0 - 8.5 g/dL 6.8 7.5  Albumin 3.5 - 5.5 g/dL 4.2 4.6  Total Bilirubin 0.0 - 1.2  mg/dL 0.3 0.3  Alkaline Phos 39 - 117 IU/L 76 104  AST 0 - 40 IU/L 23 21  ALT 0 - 44 IU/L 25 22   AMMONIA  Date Value Ref Range Status  07/05/2014 157* 27 - 102 ug/dL Final   Lab Results  Component Value Date   PHENYTOIN 15.9 07/05/2014   VALPROATE 37* 07/05/2014    12/07/13 MRI cervical -  1. At C4-5: uncovertebral joint hypertrophy and facet hypertrophy with severe biforaminal stenosis. 2. At C5-6: uncovertebral joint hypertrophy and facet hypertrophy with moderate right and severe left foraminal stenosis. 3. At C3-4: uncovertebral joint hypertrophy and facet hypertrophy with moderate biforaminal stenosis. 4. Multi-level degenerative changes (reversal of normal cervical curvature, spondylosis (anterior greater than posterior; with anterior bone bridging), degenerative endplate disease with marrow edema at C5-6 (on the left) and C7-T1 (on the right)).    ASSESSMENT AND PLAN  54 y.o. 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 More breakthrough seizures on 09/09/14 and  09/14/14. Will increase vimpat and check other labs.  Previous asterixis and tremor from hyperammonemia and higher depakote has improved.   PLAN: 1. Continue dilantin  at bedtime and depakote  twice a day. 2. Increase vimpat to  twice a day.  Patient Instructions  Increase vimpat to  twice a day.  Continue divalproex  twice a day.  Continue dilantin  at bedtime.   Orders Placed This Encounter  Procedures  . Valproic acid level  . Phenytoin level, total  . Ammonia  . CBC with Differential/Platelet  . Comprehensive metabolic panel   Meds ordered this encounter  Medications  . Lacosamide 100 MG TABS    Sig: Take 1 tablet (100 mg total) by mouth 2 (two) times daily.    Dispense:  60 tablet    Refill:  5  . divalproex (DEPAKOTE) 500 MG DR tablet    Sig: Take 2 tablets (1,000 mg total) by mouth 2 (two) times daily.    Dispense:  120 tablet    Refill:  12  . phenytoin (DILANTIN) 100 MG ER capsule    Sig: Take  PO at bedtime    Dispense:  150 capsule    Refill:  12   Return in about 3 months (around 01/03/2015).    Suanne Marker, MD 10/03/2014, 10:36 AM Certified in Neurology, Neurophysiology and Neuroimaging  [facility] [address], [address] [place], [place] [address] [phone]

## 2014-10-03 NOTE — Patient Instructions (Addendum)
Increase vimpat to  twice a day.  Continue divalproex  twice a day.  Continue dilantin  at bedtime.

## 2014-10-04 ENCOUNTER — Telehealth: Payer: Self-pay | Admitting: *Deleted

## 2014-10-04 LAB — CBC WITH DIFFERENTIAL/PLATELET
BASOS ABS: 0 10*3/uL (ref 0.0–0.2)
Basos: 0 %
EOS (ABSOLUTE): 0.1 10*3/uL (ref 0.0–0.4)
EOS: 1 %
HEMOGLOBIN: 12.6 g/dL (ref 12.6–17.7)
Hematocrit: 39.4 % (ref 37.5–51.0)
Immature Grans (Abs): 0 10*3/uL (ref 0.0–0.1)
Immature Granulocytes: 0 %
LYMPHS: 48 %
Lymphocytes Absolute: 2.6 10*3/uL (ref 0.7–3.1)
MCH: 27.6 pg (ref 26.6–33.0)
MCHC: 32 g/dL (ref 31.5–35.7)
MCV: 86 fL (ref 79–97)
Monocytes Absolute: 0.4 10*3/uL (ref 0.1–0.9)
Monocytes: 7 %
Neutrophils Absolute: 2.4 10*3/uL (ref 1.4–7.0)
Neutrophils: 44 %
Platelets: 229 10*3/uL (ref 150–379)
RBC: 4.56 x10E6/uL (ref 4.14–5.80)
RDW: 13.9 % (ref 12.3–15.4)
WBC: 5.4 10*3/uL (ref 3.4–10.8)

## 2014-10-04 LAB — AMMONIA: AMMONIA: 157 ug/dL — AB (ref 27–102)

## 2014-10-04 LAB — COMPREHENSIVE METABOLIC PANEL
ALBUMIN: 4.3 g/dL (ref 3.5–5.5)
ALT: 24 IU/L (ref 0–44)
AST: 21 IU/L (ref 0–40)
Albumin/Globulin Ratio: 1.8 (ref 1.1–2.5)
Alkaline Phosphatase: 74 IU/L (ref 39–117)
BUN/Creatinine Ratio: 15 (ref 9–20)
BUN: 10 mg/dL (ref 6–24)
Bilirubin Total: 0.3 mg/dL (ref 0.0–1.2)
CALCIUM: 9.6 mg/dL (ref 8.7–10.2)
CHLORIDE: 106 mmol/L (ref 97–108)
CO2: 27 mmol/L (ref 18–29)
CREATININE: 0.66 mg/dL — AB (ref 0.76–1.27)
GFR, EST AFRICAN AMERICAN: 126 mL/min/{1.73_m2} (ref >59)
GFR, EST NON AFRICAN AMERICAN: 109 mL/min/{1.73_m2} (ref >59)
GLUCOSE: 74 mg/dL (ref 65–99)
Globulin, Total: 2.4 g/dL (ref 1.5–4.5)
Potassium: 5.3 mmol/L — ABNORMAL HIGH (ref 3.5–5.2)
Sodium: 148 mmol/L — ABNORMAL HIGH (ref 134–144)
TOTAL PROTEIN: 6.7 g/dL (ref 6.0–8.5)

## 2014-10-04 LAB — PHENYTOIN LEVEL, TOTAL: Phenytoin (Dilantin), Serum: 11 ug/mL (ref 10.0–20.0)

## 2014-10-04 LAB — VALPROIC ACID LEVEL: Valproic Acid Lvl: 47 ug/mL — ABNORMAL LOW (ref 50–100)

## 2014-10-04 NOTE — Addendum Note (Signed)
Addended byJoycelyn Schmid on: 10/04/2014 03:43 PM   Modules accepted: Orders

## 2014-10-04 NOTE — Telephone Encounter (Signed)
Spoke with patient and informed him, per Dr Marjory Lies, his ammonia level is still high. Instructed him to continue with current medication regime and doses and verified them with patient. Advised he needs to come to office in one month for repeat ammonia lab, and depending on that lab result, Dr Marjory Lies may slowly reduce his Depakote dose. He verbalized understanding, appreciation. He then stated he already received call that new Vimpat prescription is ready for pick up.

## 2014-10-04 NOTE — Telephone Encounter (Signed)
-----   Message from Suanne Marker, MD sent at 10/04/2014  3:44 PM EDT ----- Ammonia still high. Will repeat in 1 month. Continue current meds. After next lab test, may slowly reduce depakote, which is likely causing high ammonia levels. -VRP

## 2014-10-16 ENCOUNTER — Telehealth: Payer: Self-pay | Admitting: Diagnostic Neuroimaging

## 2014-10-16 DIAGNOSIS — E7220 Disorder of urea cycle metabolism, unspecified: Secondary | ICD-10-CM

## 2014-10-16 NOTE — Telephone Encounter (Signed)
Left vm for patient informing him that per Dr Marjory Lies he may come in this week for repeat ammonia lab. Gave him specific days/times to come for lab. Left this caller's name, number if any questions.

## 2014-10-16 NOTE — Telephone Encounter (Signed)
He may come in sooner for lab testing if his tremors have increased. He may come in this week. -VRP

## 2014-10-16 NOTE — Telephone Encounter (Signed)
Patient is calling and would like to have blood work done to determine why he is shaking/tremors so much.  Patient states his Lacosamide  has been changed to 100 mg and he has been taking the higher dosage for only a couple of weeks.  Please call him to advise if he needs to come in for the blood work.  Thanks!

## 2014-10-17 ENCOUNTER — Other Ambulatory Visit (INDEPENDENT_AMBULATORY_CARE_PROVIDER_SITE_OTHER): Payer: Self-pay

## 2014-10-17 DIAGNOSIS — Z0289 Encounter for other administrative examinations: Secondary | ICD-10-CM

## 2014-10-17 DIAGNOSIS — G40309 Generalized idiopathic epilepsy and epileptic syndromes, not intractable, without status epilepticus: Secondary | ICD-10-CM

## 2014-10-18 LAB — AMMONIA: AMMONIA: 236 ug/dL — AB (ref 27–102)

## 2014-10-18 MED ORDER — DIVALPROEX SODIUM 500 MG PO DR TAB: DELAYED_RELEASE_TABLET | ORAL | Status: DC

## 2014-10-18 NOTE — Telephone Encounter (Signed)
I called patient. Ammonia level increasing and tremors are increased. Advised patient to reduce depakote to  in AM and . Will check ammonia level next week. -VRP

## 2014-10-18 NOTE — Telephone Encounter (Signed)
Patient had ammonia lab drawn on 10/17/14.

## 2014-10-24 ENCOUNTER — Telehealth: Payer: Self-pay | Admitting: *Deleted

## 2014-10-24 NOTE — Telephone Encounter (Signed)
Pt returned call. It was explained to him to come in this week for lab work, via previous tele note entered.

## 2014-10-24 NOTE — Telephone Encounter (Signed)
Left vm reminding patient Dr Marjory Lies has entered ammonia lab order for him to come in this week to be drawn. Gave patient office lab days, hours. Left this caller's name, number for any questions.

## 2014-10-25 NOTE — Telephone Encounter (Signed)
Spoke with patient and reminded him about lab. He stated he would come in tomorrow between 9:30 and 10 am.

## 2014-10-26 ENCOUNTER — Other Ambulatory Visit (INDEPENDENT_AMBULATORY_CARE_PROVIDER_SITE_OTHER): Payer: Self-pay

## 2014-10-26 DIAGNOSIS — Z0289 Encounter for other administrative examinations: Secondary | ICD-10-CM

## 2014-10-26 DIAGNOSIS — E7220 Disorder of urea cycle metabolism, unspecified: Secondary | ICD-10-CM

## 2014-10-27 LAB — AMMONIA: Ammonia: 268 ug/dL (ref 27–102)

## 2014-10-29 NOTE — Telephone Encounter (Signed)
Patient returned your call.

## 2014-10-29 NOTE — Telephone Encounter (Signed)
Pt returning call

## 2014-10-29 NOTE — Telephone Encounter (Addendum)
Left vm for patient informing him ammonia level continues to rise. Left specific instructions re: medication changes and repeat lab in one week.  Strongly requested call back to confirm with patient all Dr Richrd Humbles instructions. This RN will call patient back within 24 hours if patient does not return this call.   10:26 am Called back, No answer  10:46 am  Left vm, req call back   11:48 am  Spoke with patient and gave him specific medication instructions. He states he will run out of Vimpat on Thurs, requests pharmacy be notified of increase in dosage. He states he had a seizure last Friday, states he had " a terrible headache, and knew it was coming". He states he did not seek medical attention, denies injuries. He states "I feel fine now." Also advised patient to return to this office next Mon or Tues for repeat ammonia level lab. He stated he would come around 10 am. Informed him would route information re: Vimpat refill, to Dr Marjory Lies. He verbalized understanding.

## 2014-10-30 ENCOUNTER — Telehealth: Payer: Self-pay | Admitting: *Deleted

## 2014-10-30 ENCOUNTER — Other Ambulatory Visit: Payer: Self-pay | Admitting: Diagnostic Neuroimaging

## 2014-10-30 MED ORDER — LACOSAMIDE 200 MG PO TABS: 200.0000 mg | ORAL_TABLET | Freq: Two times a day (BID) | ORAL | Status: DC

## 2014-10-30 MED ORDER — DIVALPROEX SODIUM 500 MG PO DR TAB: 500.0000 mg | DELAYED_RELEASE_TABLET | Freq: Two times a day (BID) | ORAL | Status: DC

## 2014-10-30 MED ORDER — LACOSAMIDE 200 MG PO TABS: 200.0000 mg | ORAL_TABLET | Freq: Two times a day (BID) | ORAL | Status: AC

## 2014-10-30 NOTE — Telephone Encounter (Signed)
New rx ordered and printed. Vimpat increased to  BID and divalproex reduced to  BID; due to increasing ammonia levels. -VRP

## 2014-10-30 NOTE — Telephone Encounter (Signed)
Spoke with patient and informed him his two new prescriptions have been faxed to [facility], [facility] ([address] He requested [facility] [address] [facility] be removed form his preference list, done.  He verbalized understanding.

## 2014-11-05 ENCOUNTER — Telehealth: Payer: Self-pay | Admitting: *Deleted

## 2014-11-05 NOTE — Telephone Encounter (Signed)
Left detailed vm for patient requesting he come back in this week for ammonia lab. Gave him office hours, left this caller's name and number for questions.

## 2014-11-07 ENCOUNTER — Other Ambulatory Visit (INDEPENDENT_AMBULATORY_CARE_PROVIDER_SITE_OTHER): Payer: Self-pay

## 2014-11-07 DIAGNOSIS — Z0289 Encounter for other administrative examinations: Secondary | ICD-10-CM

## 2014-11-07 DIAGNOSIS — E7220 Disorder of urea cycle metabolism, unspecified: Secondary | ICD-10-CM

## 2014-11-08 LAB — AMMONIA: AMMONIA: 154 ug/dL — AB (ref 27–102)

## 2014-11-08 NOTE — Progress Notes (Signed)
Ammonia is improving. Will repeat in 2 weeks. pls let patient know. -VRP

## 2014-11-09 ENCOUNTER — Telehealth: Payer: Self-pay

## 2014-11-09 NOTE — Telephone Encounter (Signed)
-----   Message from Suanne Marker, MD sent at 11/08/2014  4:59 PM EDT ----- Ammonia level is improving. pls let patient know. I will recheck level in 2 weeks. -VRP

## 2014-11-09 NOTE — Telephone Encounter (Signed)
Pt called back to talk about lab work . Pt is ok with a call back on Monday.

## 2014-11-09 NOTE — Telephone Encounter (Signed)
Returned call. No answer. Will fwd to Ambrose Pancoast for call on Monday.

## 2014-11-09 NOTE — Telephone Encounter (Signed)
Called patient.  No answer.

## 2014-11-12 NOTE — Telephone Encounter (Signed)
Pt called about lab work. Please leave message. [phone]

## 2014-11-12 NOTE — Progress Notes (Signed)
This encounter was created in error - please disregard.

## 2014-11-12 NOTE — Addendum Note (Signed)
Addended by: Doree Barthel on: 11/12/2014 08:20 AM   Modules accepted: Level of Service, SmartSet

## 2014-11-12 NOTE — Telephone Encounter (Signed)
Spoke with patient and informed him, per Dr Marjory Lies, his ammonia level continues to improve. He will need to repeat this lab in 2 weeks from 11/08/14. Discussed with patient returning for next lab around 11/22/14. He verbalized understanding, stated he is feeling well, voiced appreciation for this call.

## 2014-11-26 ENCOUNTER — Other Ambulatory Visit (INDEPENDENT_AMBULATORY_CARE_PROVIDER_SITE_OTHER): Payer: Self-pay

## 2014-11-26 DIAGNOSIS — E7220 Disorder of urea cycle metabolism, unspecified: Secondary | ICD-10-CM

## 2014-11-26 DIAGNOSIS — Z0289 Encounter for other administrative examinations: Secondary | ICD-10-CM

## 2014-11-27 ENCOUNTER — Telehealth: Payer: Self-pay | Admitting: *Deleted

## 2014-11-27 LAB — AMMONIA: Ammonia: 87 ug/dL (ref 27–102)

## 2014-11-27 NOTE — Telephone Encounter (Signed)
Per Dr Marjory Lies, left voice mail for patient informing him his ammonia level is now normal. Left this caller's name, number for questions.

## 2014-11-27 NOTE — Telephone Encounter (Signed)
-----   Message from Suanne Marker, MD sent at 11/27/2014 12:21 PM EDT ----- Ammonia is back to normal. Let patient know. -VRP

## 2015-01-16 ENCOUNTER — Ambulatory Visit: Payer: Medicare Other | Admitting: Diagnostic Neuroimaging

## 2015-04-12 ENCOUNTER — Other Ambulatory Visit: Payer: Self-pay | Admitting: Diagnostic Neuroimaging

## 2015-04-15 NOTE — Telephone Encounter (Signed)
Spoke with patient re: Vimpat refill request. He stated he has moved to [facility] and is seeing another neurologist. He stated all his seizure medications are being ordered by his new physician. Informed him would document in his record. He verbalized understanding, appreciation.

## 2015-05-13 ENCOUNTER — Other Ambulatory Visit: Payer: Self-pay | Admitting: Diagnostic Neuroimaging

## 2015-05-13 NOTE — Telephone Encounter (Signed)
LVM requesting call back to schedule FU and to clarify correct pharmacy. Pateint last seen 09/2014, cancelled 12/2014 FU.

## 2015-05-13 NOTE — Telephone Encounter (Signed)
Patient [phone] [facility] [place], [place] and [place] for [place]. Pharmacy is [facility].

## 2015-10-16 ENCOUNTER — Other Ambulatory Visit: Payer: Self-pay | Admitting: Diagnostic Neuroimaging

## 2020-02-16 ENCOUNTER — Other Ambulatory Visit: Payer: Self-pay

## 2020-02-16 ENCOUNTER — Emergency Department (INDEPENDENT_AMBULATORY_CARE_PROVIDER_SITE_OTHER)
Admission: RE | Admit: 2020-02-16 | Discharge: 2020-02-16 | Disposition: A | Discharge status: Home / Self Care | Payer: Medicare Other | Source: Ambulatory Visit

## 2020-02-16 VITALS — BP 127/83 | HR 98 | Temp 99.1°F | Resp 16

## 2020-02-16 DIAGNOSIS — J209 Acute bronchitis, unspecified: Secondary | ICD-10-CM

## 2020-02-16 MED ORDER — DOXYCYCLINE HYCLATE 100 MG PO CAPS: 100.0000 mg | ORAL_CAPSULE | Freq: Two times a day (BID) | ORAL | 0 refills | Status: AC

## 2020-02-16 MED ORDER — BENZONATATE 100 MG PO CAPS: 100.0000 mg | ORAL_CAPSULE | Freq: Three times a day (TID) | ORAL | 0 refills | Status: DC

## 2020-02-16 NOTE — Discharge Instructions (Signed)
  Please take antibiotics as prescribed and be sure to complete entire course even if you start to feel better to ensure infection does not come back.  Follow up with family medicine next week if not improving.   Call 911 or have someone drive you to the hospital if symptoms significantly worsening.

## 2020-02-16 NOTE — ED Provider Notes (Signed)
[facility]    CSN: [id number] Arrival date & time: 02/16/20  1357      History   Chief Complaint Chief Complaint  Patient presents with  . Cough  . Appointment    HPI Caige Almeda is a 59 y.o. male.   HPI  Felton Lasota is a 59 y.o. male presenting to UC with c/o 8 days of worsening cough, congestion, body aches.  He has been around his grandchild who had a cold recently. Pt denies fever, n/v/d. Mild chest soreness from the cough. No SOB at this time.  COVID booster 12/30/19.   Past Medical History:  Diagnosis Date  . Hypertension   . Nervous   . Seizure (HCC)    last sz 09/14/14    There are no problems to display for this patient.   Past Surgical History:  Procedure Laterality Date  . TOE SURGERY     3 toes removed  . TRIGGER FINGER RELEASE Right 09/14/14   thumb       Home Medications    Prior to Admission medications   Medication Sig Start Date End Date Taking? Authorizing Provider  benzonatate (TESSALON) 100 MG capsule Take 1 capsule (100 mg total) by mouth every 8 (eight) hours. 02/16/20  Yes Jadyn Brasher O, PA-C  doxycycline (VIBRAMYCIN) 100 MG capsule Take 1 capsule (100 mg total) by mouth 2 (two) times daily for 7 days. 02/16/20 02/23/20 Yes [place] Gipe O, PA-C  amLODipine (NORVASC) 10 MG tablet Take 10 mg by mouth daily.    [provider]  aspirin 81 MG tablet Take 81 mg by mouth daily.    [provider]  atorvastatin (LIPITOR) 80 MG tablet Take 80 mg by mouth daily.    [provider]  divalproex (DEPAKOTE) 500 MG DR tablet Take 1 tablet (500 mg total) by mouth 2 (two) times daily. 10/30/14   Penumalli, Glenford Bayley, MD  lacosamide (VIMPAT) 200 MG TABS tablet Take 1 tablet (200 mg total) by mouth 2 (two) times daily. 10/30/14   Penumalli, Glenford Bayley, MD  Multiple Vitamins-Minerals (MULTIVITAMIN WITH MINERALS) tablet Take 1 tablet by mouth daily.    [provider]  phenytoin (DILANTIN) 100 MG ER capsule Take  500mg  PO at bedtime 10/03/14   Penumalli, Vikram R, MD  triamcinolone cream (KENALOG) 0.1 % Apply 1 application topically as needed. 12/07/13   [provider]    Family History Family History  Problem Relation Age of Onset  . Cancer Father     Social History Social History   Tobacco Use  . Smoking status: Never Smoker  . Smokeless tobacco: Never Used  Substance Use Topics  . Alcohol use: No    Comment: 09/22/13  . Drug use: No     Allergies   Hydrochlorothiazide, Keppra [levetiracetam], and Zofran [ondansetron hcl]   Review of Systems Review of Systems  Constitutional: Negative for chills and fever.  HENT: Positive for congestion and sore throat. Negative for ear pain, trouble swallowing and voice change.   Respiratory: Positive for cough. Negative for shortness of breath.   Cardiovascular: Negative for chest pain and palpitations.  Gastrointestinal: Negative for abdominal pain, diarrhea, nausea and vomiting.  Musculoskeletal: Positive for arthralgias, back pain and myalgias.  Skin: Negative for rash.  Neurological: Negative for dizziness, light-headedness and headaches.  All other systems reviewed and are negative.    Physical Exam Triage Vital Signs ED Triage Vitals  Enc Vitals Group     BP 02/16/20 1424  127/83     Pulse Rate 02/16/20 1424 98     Resp 02/16/20 1424 16     Temp 02/16/20 1424 99.1 F (37.3 C)     Temp Source 02/16/20 1424 Oral     SpO2 02/16/20 1424 98 %     Weight --      Height --      Head Circumference --      Peak Flow --      Pain Score 02/16/20 1427 0     Pain Loc --      Pain Edu? --      Excl. in GC? --    No data found.  Updated Vital Signs BP 127/83 (BP Location: Right Arm)   Pulse 98   Temp 99.1 F (37.3 C) (Oral)   Resp 16   SpO2 98%   Visual Acuity Right Eye Distance:   Left Eye Distance:   Bilateral Distance:    Right Eye Near:   Left Eye Near:    Bilateral Near:     Physical Exam Vitals and  nursing note reviewed.  Constitutional:      General: He is not in acute distress.    Appearance: Normal appearance. He is well-developed and well-nourished. He is not ill-appearing, toxic-appearing or diaphoretic.  HENT:     Head: Normocephalic and atraumatic.     Right Ear: Tympanic membrane and ear canal normal.     Left Ear: Tympanic membrane and ear canal normal.     Nose: Nose normal.     Right Sinus: No maxillary sinus tenderness or frontal sinus tenderness.     Left Sinus: No maxillary sinus tenderness or frontal sinus tenderness.     Mouth/Throat:     Lips: Pink.     Mouth: Mucous membranes are moist.     Pharynx: Oropharynx is clear. Uvula midline. No pharyngeal swelling, oropharyngeal exudate, posterior oropharyngeal erythema or uvula swelling.  Eyes:     Extraocular Movements: EOM normal.  Cardiovascular:     Rate and Rhythm: Normal rate and regular rhythm.  Pulmonary:     Effort: Pulmonary effort is normal. No respiratory distress.     Breath sounds: No stridor. Wheezing and rhonchi present. No rales.     Comments: Diffuse wheeze and rhonchi Musculoskeletal:        General: Normal range of motion.     Cervical back: Normal range of motion and neck supple.  Lymphadenopathy:     Cervical: No cervical adenopathy.  Skin:    General: Skin is warm and dry.  Neurological:     Mental Status: He is alert and oriented to person, place, and time.  Psychiatric:        Mood and Affect: Mood and affect normal.        Behavior: Behavior normal.      UC Treatments / Results  Labs (all labs ordered are listed, but only abnormal results are displayed) Labs Reviewed  COVID-19, FLU A+B AND RSV    EKG   Radiology No results found.  Procedures Procedures (including critical care time)  Medications Ordered in UC Medications - No data to display  Initial Impression / Assessment and Plan / UC Course  I have reviewed the triage vital signs and the nursing  notes.  Pertinent labs & imaging results that were available during my care of the patient were reviewed by me and considered in my medical decision making (see chart for details).    Hx and  exam c/w acute bronchitis, with worsening symptoms, will cover for bacterial infection. COVID/Flu/RSV pending F/u with PCP as needed AVS given  Final Clinical Impressions(s) / UC Diagnoses   Final diagnoses:  Acute bronchitis, unspecified organism     Discharge Instructions      Please take antibiotics as prescribed and be sure to complete entire course even if you start to feel better to ensure infection does not come back.  Follow up with family medicine next week if not improving.   Call 911 or have someone drive you to the hospital if symptoms significantly worsening.     ED Prescriptions    Medication Sig Dispense Auth. Provider   benzonatate (TESSALON) 100 MG capsule Take 1 capsule (100 mg total) by mouth every 8 (eight) hours. 21 capsule Waylan Rocher O, PA-C   doxycycline (VIBRAMYCIN) 100 MG capsule Take 1 capsule (100 mg total) by mouth 2 (two) times daily for 7 days. 14 capsule Lurene Shadow, [place]     PDMP not reviewed this encounter.   Lurene Shadow, PA-C 02/17/20 1440

## 2020-02-16 NOTE — ED Triage Notes (Signed)
Cough x 8 days  Congestion  OTC alka seltzer  Pt has been around his grandchild who has a cold  COVID booster 12/30/19 Flu vaccine

## 2020-02-29 ENCOUNTER — Emergency Department (INDEPENDENT_AMBULATORY_CARE_PROVIDER_SITE_OTHER): Payer: Medicare Other

## 2020-02-29 ENCOUNTER — Other Ambulatory Visit: Payer: Self-pay

## 2020-02-29 ENCOUNTER — Emergency Department (INDEPENDENT_AMBULATORY_CARE_PROVIDER_SITE_OTHER)
Admission: RE | Admit: 2020-02-29 | Discharge: 2020-02-29 | Disposition: A | Discharge status: Home / Self Care | Payer: Medicare Other | Source: Ambulatory Visit

## 2020-02-29 VITALS — BP 138/84 | HR 72 | Temp 98.8°F | Resp 16 | Ht 74.5 in | Wt 200.6 lb

## 2020-02-29 DIAGNOSIS — R053 Chronic cough: Secondary | ICD-10-CM

## 2020-02-29 DIAGNOSIS — J189 Pneumonia, unspecified organism: Secondary | ICD-10-CM | POA: Diagnosis not present

## 2020-02-29 DIAGNOSIS — J181 Lobar pneumonia, unspecified organism: Secondary | ICD-10-CM

## 2020-02-29 DIAGNOSIS — Z20822 Contact with and (suspected) exposure to covid-19: Secondary | ICD-10-CM

## 2020-02-29 MED ORDER — LEVOFLOXACIN 500 MG PO TABS: 500.0000 mg | ORAL_TABLET | Freq: Every day | ORAL | 0 refills | Status: DC

## 2020-02-29 NOTE — ED Provider Notes (Signed)
[facility]    CSN: [id number] Arrival date & time: 02/29/20  1056      History   Chief Complaint Chief Complaint  Patient presents with  . Cough  . Appointment    HPI Frank Newton is a 60 y.o. male.   HPI Frank Newton is a 60 y.o. male presenting to UC with c/o persistent hacking cough that started over 3 weeks ago. He was seen at [facility] on 12/31 for a cough that had already been going on 8 days at that time. He tested negative for COVID, was started on doxycycline, which he completed but cough has persisted.  He has since been exposed to COVID from his son.  Wife is a Engineer, civil (consulting) and is concerned pt has pneumonia. Pt denies fever, chills, n/v/d. Mild chest soreness from the cough but denies SOB. No hx of asthma but has an albuterol inhaler he has been trying, without relief.    Past Medical History:  Diagnosis Date  . Hypertension   . Nervous   . Seizure (HCC)    last sz 09/14/14    There are no problems to display for this patient.   Past Surgical History:  Procedure Laterality Date  . TOE SURGERY     3 toes removed  . TRIGGER FINGER RELEASE Right 09/14/14   thumb       Home Medications    Prior to Admission medications   Medication Sig Start Date End Date Taking? Authorizing Provider  amLODipine (NORVASC) 10 MG tablet Take 10 mg by mouth daily.    [provider]  aspirin 81 MG tablet Take 81 mg by mouth daily.    [provider]  atorvastatin (LIPITOR) 80 MG tablet Take 80 mg by mouth daily.    [provider]  benzonatate (TESSALON) 100 MG capsule Take 1 capsule (100 mg total) by mouth every 8 (eight) hours. 02/16/20   Lurene Shadow, PA-C  divalproex (DEPAKOTE) 500 MG DR tablet Take 1 tablet (500 mg total) by mouth 2 (two) times daily. 10/30/14   Penumalli, Glenford Bayley, MD  lacosamide (VIMPAT) 200 MG TABS tablet Take 1 tablet (200 mg total) by mouth 2 (two) times daily. 10/30/14   Penumalli, Glenford Bayley, MD  levofloxacin (LEVAQUIN) 500  MG tablet Take 1 tablet (500 mg total) by mouth daily. 02/29/20   Lurene Shadow, PA-C  Multiple Vitamins-Minerals (MULTIVITAMIN WITH MINERALS) tablet Take 1 tablet by mouth daily.    [provider]  phenytoin (DILANTIN) 100 MG ER capsule Take 500mg  PO at bedtime 10/03/14   Penumalli, Vikram R, MD  triamcinolone cream (KENALOG) 0.1 % Apply 1 application topically as needed. 12/07/13   [provider]    Family History Family History  Problem Relation Age of Onset  . Cancer Father     Social History Social History   Tobacco Use  . Smoking status: Never Smoker  . Smokeless tobacco: Never Used  Substance Use Topics  . Alcohol use: No    Comment: 09/22/13  . Drug use: No     Allergies   Hydrochlorothiazide, Keppra [levetiracetam], and Zofran [ondansetron hcl]   Review of Systems Review of Systems  Constitutional: Negative for chills and fever.  HENT: Positive for congestion. Negative for ear pain, sore throat, trouble swallowing and voice change.   Respiratory: Positive for cough. Negative for shortness of breath.   Cardiovascular: Negative for chest pain and palpitations.  Gastrointestinal: Negative for abdominal pain, diarrhea, nausea and vomiting.  Musculoskeletal: Negative for arthralgias, back pain and myalgias.  Skin: Negative for rash.  Neurological: Negative for dizziness, light-headedness and headaches.  All other systems reviewed and are negative.    Physical Exam Triage Vital Signs ED Triage Vitals  Enc Vitals Group     BP 02/29/20 1147 138/84     Pulse Rate 02/29/20 1147 72     Resp 02/29/20 1147 16     Temp 02/29/20 1147 98.8 F (37.1 C)     Temp Source 02/29/20 1147 Oral     SpO2 02/29/20 1147 98 %     Weight 02/29/20 1148 200 lb 9.9 oz (91 kg)     Height 02/29/20 1148 6' 2.5" (1.892 m)     Head Circumference --      Peak Flow --      Pain Score 02/29/20 1148 0     Pain Loc --      Pain Edu? --      Excl. in GC? --    No data  found.  Updated Vital Signs BP 138/84 (BP Location: Right Arm)   Pulse 72   Temp 98.8 F (37.1 C) (Oral)   Resp 16   Ht 6' 2.5" (1.892 m)   Wt 200 lb 9.9 oz (91 kg)   SpO2 98%   BMI 25.41 kg/m   Visual Acuity Right Eye Distance:   Left Eye Distance:   Bilateral Distance:    Right Eye Near:   Left Eye Near:    Bilateral Near:     Physical Exam Vitals and nursing note reviewed.  Constitutional:      General: He is not in acute distress.    Appearance: Normal appearance. He is well-developed and well-nourished. He is not ill-appearing, toxic-appearing or diaphoretic.  HENT:     Head: Normocephalic and atraumatic.     Right Ear: Tympanic membrane and ear canal normal.     Left Ear: Tympanic membrane and ear canal normal.     Nose: Nose normal.     Right Sinus: No maxillary sinus tenderness or frontal sinus tenderness.     Left Sinus: No maxillary sinus tenderness or frontal sinus tenderness.     Mouth/Throat:     Lips: Pink.     Mouth: Mucous membranes are moist.     Pharynx: Oropharynx is clear. Uvula midline.  Eyes:     Extraocular Movements: EOM normal.  Cardiovascular:     Rate and Rhythm: Normal rate and regular rhythm.  Pulmonary:     Effort: Pulmonary effort is normal. No respiratory distress.     Breath sounds: Normal breath sounds. No stridor. No wheezing, rhonchi or rales.     Comments: Mildly productive hacking cough on exam, especially with deep breath. No respiratory distress. Able to speak in full sentences.  Musculoskeletal:        General: Normal range of motion.     Cervical back: Normal range of motion.  Skin:    General: Skin is warm and dry.  Neurological:     Mental Status: He is alert and oriented to person, place, and time.  Psychiatric:        Mood and Affect: Mood and affect normal.        Behavior: Behavior normal.      UC Treatments / Results  Labs (all labs ordered are listed, but only abnormal results are displayed) Labs  Reviewed  SARS-COV-2 RNA,(COVID-19) QUALITATIVE NAAT    EKG   Radiology DG Chest 2  View  Result Date: 02/29/2020 CLINICAL DATA:  Persistent cough EXAM: CHEST - 2 VIEW COMPARISON:  None. FINDINGS: Small focus of airspace opacity noted left base. Right lung clear. The cardiopericardial silhouette is within normal limits for size. The visualized bony structures of the thorax show no acute abnormality. IMPRESSION: Subtle atelectasis or pneumonia at the left base. Electronically Signed   By: Kennith Center M.D.   On: 02/29/2020 12:26    Procedures Procedures (including critical care time)  Medications Ordered in UC Medications - No data to display  Initial Impression / Assessment and Plan / UC Course  I have reviewed the triage vital signs and the nursing notes.  Pertinent labs & imaging results that were available during my care of the patient were reviewed by me and considered in my medical decision making (see chart for details).    Pt retested for COVID due to known exposure since last visit. COVID PCR pending CXR: atelectasis vs pneumonia, given duration of symptoms and pt's age, will tx for bacterial pneumonia with Levaquin F/u with PCP  Discussed symptoms that warrant emergent care in the ED.  Final Clinical Impressions(s) / UC Diagnoses   Final diagnoses:  Persistent cough  Exposure to COVID-19 virus  Community acquired pneumonia of left lower lobe of lung     Discharge Instructions      Please take the antibiotic as prescribed. Be sure to stay well hydrated and get plenty of rest. If you develop muscle or joint pain, diarrhea, or weakness, please stop taking the antibiotic and call our office or your primary care provider to discuss these symptoms.     ED Prescriptions    Medication Sig Dispense Auth. Provider   levofloxacin (LEVAQUIN) 500 MG tablet  (Status: Discontinued) Take 1 tablet (500 mg total) by mouth daily. 7 tablet Doroteo Glassman, Kalei Mckillop O, PA-C   levofloxacin  (LEVAQUIN) 500 MG tablet Take 1 tablet (500 mg total) by mouth daily. 7 tablet Lurene Shadow, PA-C     PDMP not reviewed this encounter.   Lurene Shadow, PA-C 02/29/20 1255

## 2020-02-29 NOTE — Discharge Instructions (Signed)
°  Please take the antibiotic as prescribed. Be sure to stay well hydrated and get plenty of rest. If you develop muscle or joint pain, diarrhea, or weakness, please stop taking the antibiotic and call our office or your primary care provider to discuss these symptoms.

## 2020-02-29 NOTE — ED Triage Notes (Addendum)
Pt seen 02/16/20 - cough has not resolved  Pt & his wife are concerned about pneumonia Denies fever Recent exposure to COVID 19 from his son

## 2020-03-03 LAB — SARS-COV-2 RNA,(COVID-19) QUALITATIVE NAAT: SARS CoV2 RNA: NOT DETECTED

## 2020-04-01 ENCOUNTER — Ambulatory Visit: Payer: Self-pay

## 2021-09-15 IMAGING — DX DG CHEST 2V
2 series · 2 of 2 positions shown · non-contrast
Comparison: None.

CLINICAL DATA: Persistent cough

EXAM:
CHEST - 2 VIEW

[chest pa]
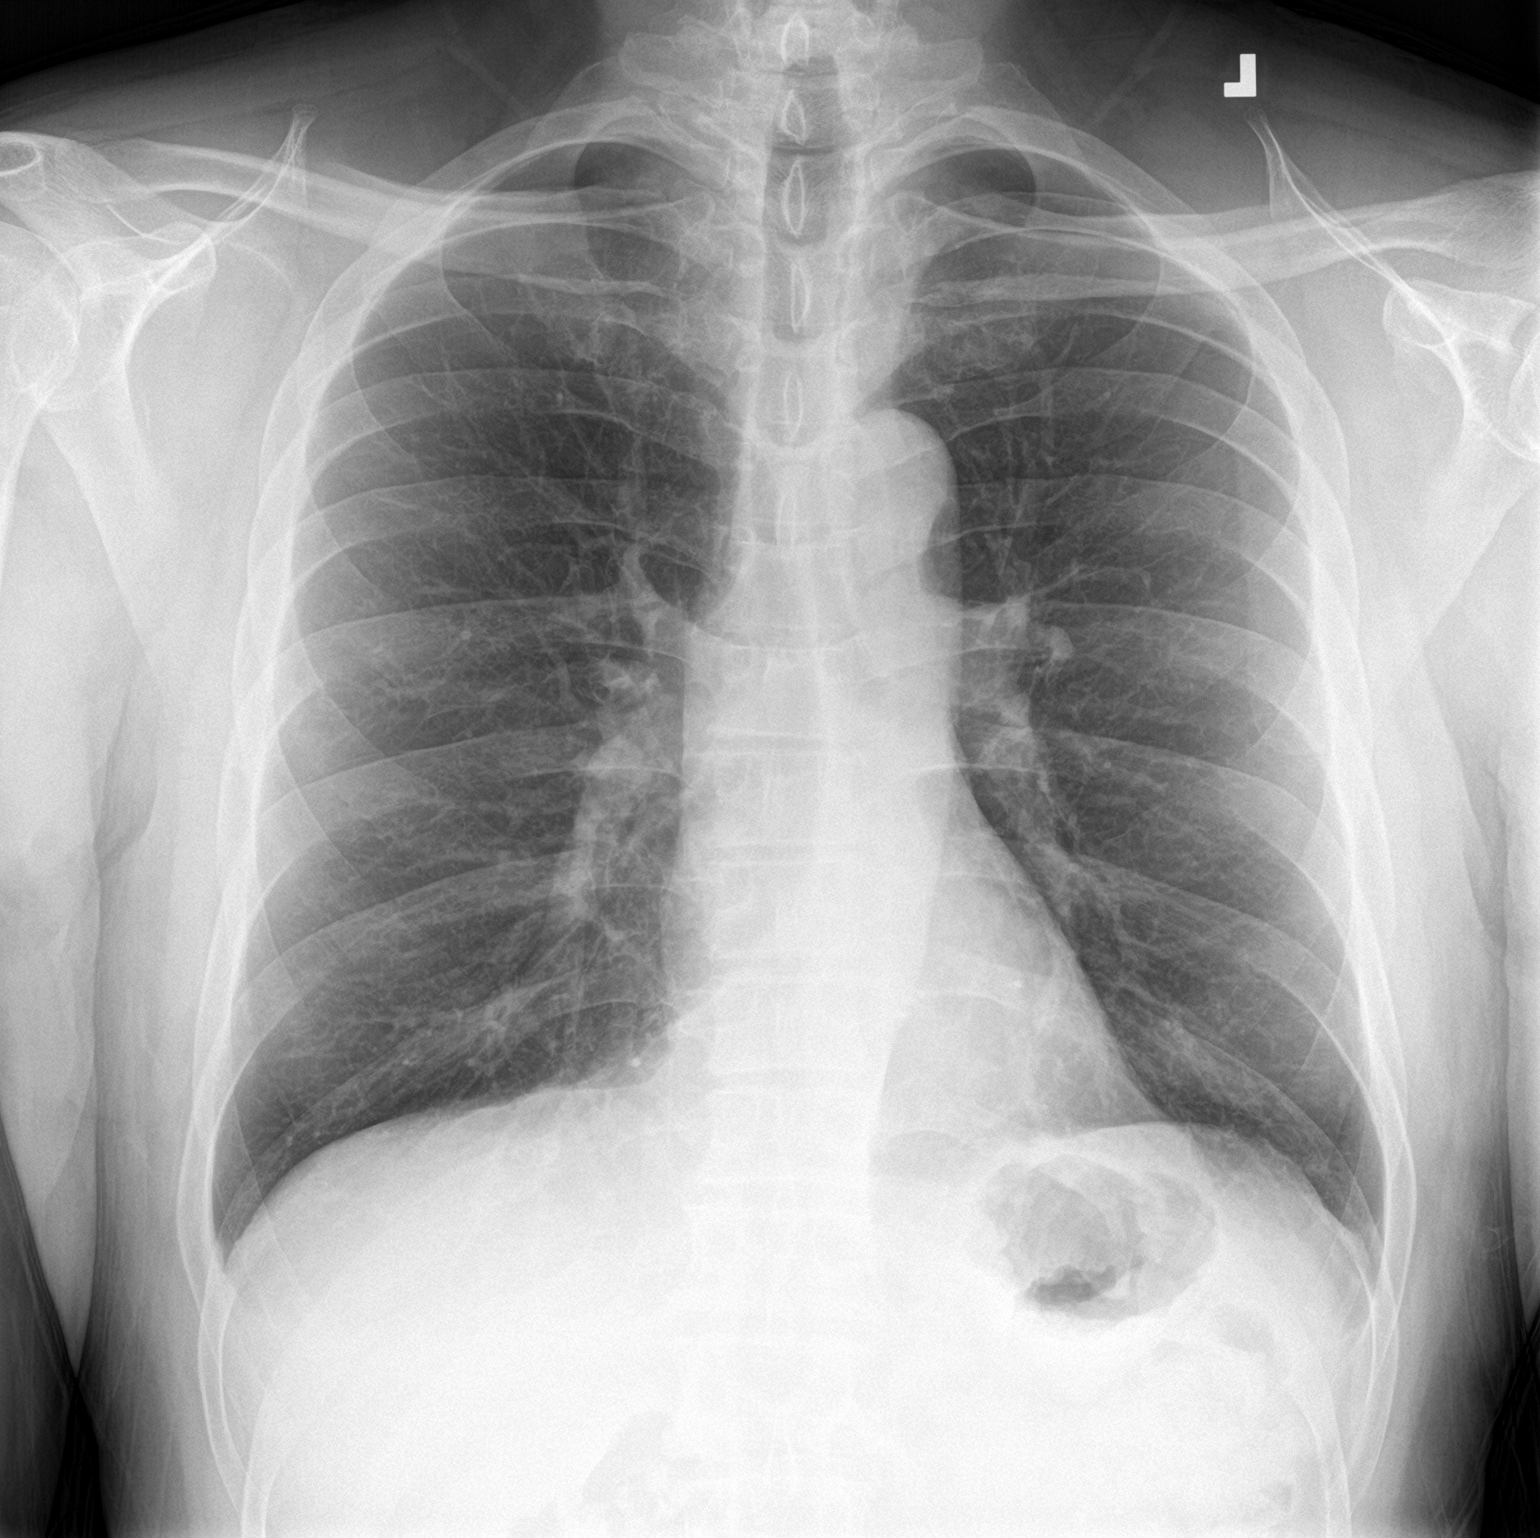

[chest lat]
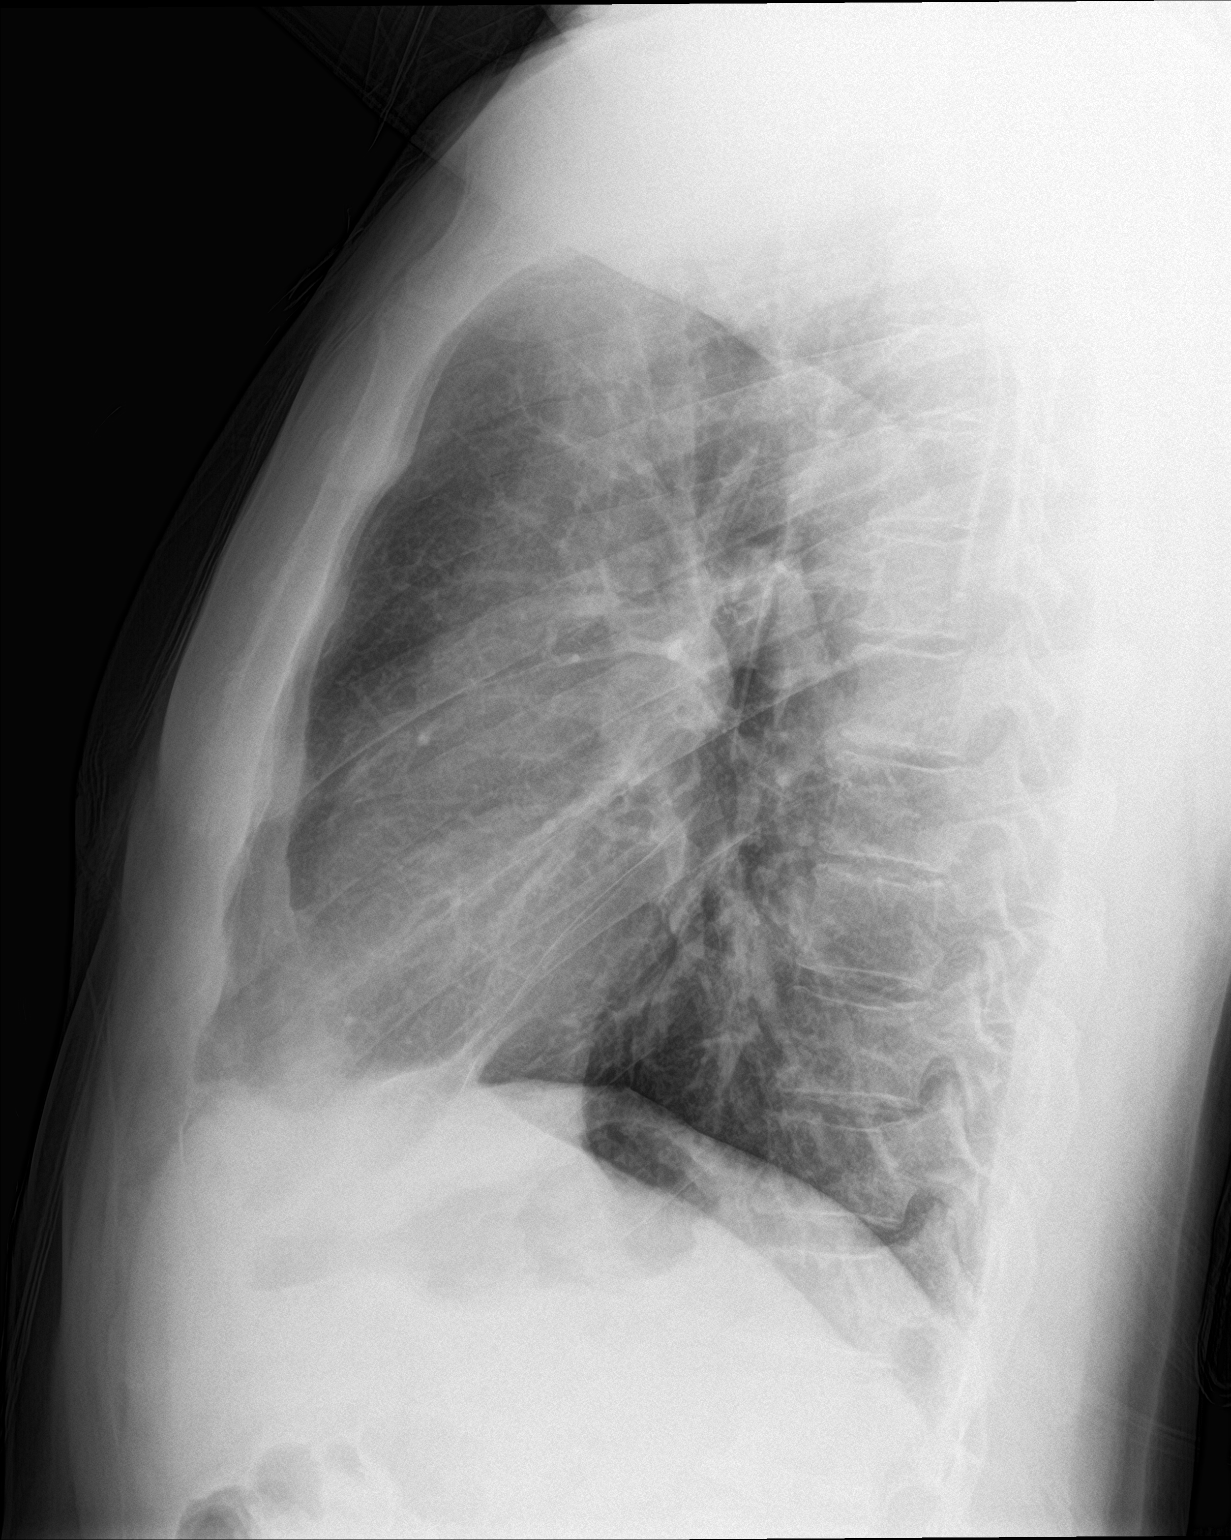

[2 of 2 positions shown; findings below may reference images not displayed]

FINDINGS: Small focus of airspace opacity noted left base. Right lung clear.
The cardiopericardial silhouette is within normal limits for size.
The visualized bony structures of the thorax show no acute
abnormality.
IMPRESSION: Subtle atelectasis or pneumonia at the left base.

## 2021-11-29 ENCOUNTER — Encounter: Payer: Self-pay | Admitting: Emergency Medicine

## 2021-11-29 ENCOUNTER — Ambulatory Visit: Admit: 2021-11-29 | Discharge status: Absent without leave | Payer: Medicare Other

## 2021-11-29 ENCOUNTER — Ambulatory Visit
Admission: EM | Admit: 2021-11-29 | Discharge: 2021-11-29 | Disposition: A | Discharge status: Home / Self Care | Payer: Medicare Other | Attending: Family Medicine | Admitting: Family Medicine

## 2021-11-29 DIAGNOSIS — R338 Other retention of urine: Secondary | ICD-10-CM

## 2021-11-29 DIAGNOSIS — N10 Acute tubulo-interstitial nephritis: Secondary | ICD-10-CM

## 2021-11-29 LAB — POCT URINALYSIS DIP (MANUAL ENTRY)
Bilirubin, UA: NEGATIVE
Glucose, UA: NEGATIVE mg/dL
Ketones, POC UA: NEGATIVE mg/dL
Nitrite, UA: POSITIVE — AB
Protein Ur, POC: >=300 mg/dL — AB
Spec Grav, UA: 1.02 (ref 1.010–1.025)
Urobilinogen, UA: 0.2 E.U./dL
pH, UA: 5.5 (ref 5.0–8.0)

## 2021-11-29 MED ORDER — ACETAMINOPHEN 500 MG PO TABS
1000.0000 mg | ORAL_TABLET | ORAL | Status: AC
  Administered 2021-11-29: 1000 mg via ORAL

## 2021-11-29 NOTE — Discharge Instructions (Signed)
Go to the emergency room

## 2021-11-29 NOTE — ED Notes (Signed)
Patient is being discharged from the Urgent Care and sent to the Emergency Department via POV w/ his wife - EMS refused by patient . Per Dr Meda Coffee, patient is in need of higher level of care due to high temp & rick of urosepsis.. Patient is aware and verbalizes understanding of plan of care.  Vitals:   11/29/21 1522  BP: (!) 152/89  Pulse: (!) 102  Resp: 18  Temp: (!) 102.2 F (39 C)  SpO2: 100%   Tylenol 1gm given at 1530- report called to Tillie Rung, RN in ED at [facility] ED in [facility]

## 2021-11-29 NOTE — ED Triage Notes (Signed)
Headache & urinary retention since Thursday Took alkaseltzer cold med last night  No pain meds today  Drinking OJ & water Unable to eat since Thursday

## 2021-11-29 NOTE — ED Provider Notes (Signed)
[facility]    CSN: [id number] Arrival date & time: 11/29/21  1415      History   Chief Complaint Chief Complaint  Patient presents with   Urinary Frequency   Fever    HPI Frank Newton is a 61 y.o. male.   HPI  Patient with known prostatic enlargement is here with urinary retention since Thursday.  Dribbling of urine.  He now has a fever of 102, abdominal pain, flank pain, body aches.  He states that he has had chills.  He states he was talking out of his head at home.  His wife has been trying to get him to come to the doctor for the last couple of days.  He finally agreed to come here.  Past Medical History:  Diagnosis Date   Hypertension    Nervous    Seizure ([facility])    last sz 09/14/14    There are no problems to display for this patient.   Past Surgical History:  Procedure Laterality Date   TOE SURGERY     3 toes removed   TRIGGER FINGER RELEASE Right 09/14/14   thumb       Home Medications    Prior to Admission medications   Medication Sig Start Date End Date Taking? Authorizing Provider  albuterol (VENTOLIN HFA) 108 (90 Base) MCG/ACT inhaler Inhale 2 puffs into the lungs every 6 (six) hours as needed. 03/09/19  Yes [provider]  famotidine (PEPCID) 40 MG tablet Take by mouth. 03/01/19  Yes [provider]  tamsulosin (FLOMAX) 0.4 MG CAPS capsule Take by mouth. 05/01/20  Yes [provider]  amLODipine (NORVASC) 10 MG tablet Take 10 mg by mouth daily.    [provider]  aspirin 81 MG tablet Take 81 mg by mouth daily.    [provider]  atorvastatin (LIPITOR) 80 MG tablet Take 80 mg by mouth daily.    [provider]  lacosamide (VIMPAT) 200 MG TABS tablet Take 1 tablet (200 mg total) by mouth 2 (two) times daily. 10/30/14   Penumalli, Earlean Polka, MD  Multiple Vitamins-Minerals (MULTIVITAMIN WITH MINERALS) tablet Take 1 tablet by mouth daily.    [provider]  NYSTATIN powder  SMARTSIG:2 Topical Twice Daily 11/16/21   [provider]  phenytoin (DILANTIN) 100 MG ER capsule Take 500mg  PO at bedtime 10/03/14   Penumalli, Earlean Polka, MD    Family History Family History  Problem Relation Age of Onset   Cancer Father     Social History Social History   Tobacco Use   Smoking status: Never   Smokeless tobacco: Never  Vaping Use   Vaping Use: Never used  Substance Use Topics   Alcohol use: No    Comment: 09/22/13   Drug use: No     Allergies   Hydrochlorothiazide, Keppra [levetiracetam], and Zofran [ondansetron hcl]   Review of Systems Review of Systems See HPI  Physical Exam Triage Vital Signs ED Triage Vitals  Enc Vitals Group     BP 11/29/21 1522 (!) 152/89     Pulse Rate 11/29/21 1522 (!) 102     Resp 11/29/21 1522 18     Temp 11/29/21 1522 (!) 102.2 F (39 C)     Temp Source 11/29/21 1522 Oral     SpO2 11/29/21 1522 100 %     Weight 11/29/21 1526 205 lb (93 kg)     Height 11/29/21 1526 6' 2.5" (1.892 m)     Head  Circumference --      Peak Flow --      Pain Score 11/29/21 1526 6     Pain Loc --      Pain Edu? --      Excl. in [place]? --    No data found.  Updated Vital Signs BP (!) 152/89 (BP Location: Left Arm)   Pulse (!) 102   Temp (!) 102.2 F (39 C) (Oral)   Resp 18   Ht 6' 2.5" (1.892 m)   Wt 93 kg   SpO2 100%   BMI 25.97 kg/m      Physical Exam Constitutional:      General: He is in acute distress.     Appearance: He is well-developed and normal weight. He is ill-appearing and toxic-appearing.  HENT:     Head: Normocephalic and atraumatic.  Eyes:     Conjunctiva/sclera: Conjunctivae normal.     Pupils: Pupils are equal, round, and reactive to light.  Cardiovascular:     Rate and Rhythm: Regular rhythm. Tachycardia present.  Pulmonary:     Effort: Pulmonary effort is normal. No respiratory distress.     Breath sounds: Normal breath sounds.  Chest:     Chest wall: No tenderness.  Abdominal:     General:  There is no distension.     Palpations: Abdomen is soft.     Tenderness: There is abdominal tenderness. There is right CVA tenderness and left CVA tenderness.  Musculoskeletal:        General: Normal range of motion.     Cervical back: Normal range of motion.  Skin:    General: Skin is warm and dry.  Neurological:     General: No focal deficit present.     Mental Status: He is alert.      UC Treatments / Results  Labs (all labs ordered are listed, but only abnormal results are displayed) Labs Reviewed  POCT URINALYSIS DIP (MANUAL ENTRY) - Abnormal; Notable for the following components:      Result Value   Color, UA other (*)    Clarity, UA cloudy (*)    Blood, UA large (*)    Protein Ur, POC >=300 (*)    Nitrite, UA Positive (*)    Leukocytes, UA Moderate (2+) (*)    All other components within normal limits  URINE CULTURE    EKG   Radiology No results found.  Procedures Procedures (including critical care time)  Medications Ordered in UC Medications - No data to display  Initial Impression / Assessment and Plan / UC Course  I have reviewed the triage vital signs and the nursing notes.  Pertinent labs & imaging results that were available during my care of the patient were reviewed by me and considered in my medical decision making (see chart for details).     I explained to the patient and his wife that he is acutely ill.  Possibly an early sepsis.  Definitely needs IV, pain management, antibiotics, and a Foley catheter.  Report is called to the hospital for them. Final Clinical Impressions(s) / UC Diagnoses   Final diagnoses:  Acute retention of urine  Acute pyelonephritis     Discharge Instructions      Go to the emergency room   ED Prescriptions   None    PDMP not reviewed this encounter.   Raylene Everts, MD 11/29/21 [phone]

## 2021-11-30 ENCOUNTER — Telehealth: Payer: Self-pay | Admitting: Emergency Medicine

## 2021-11-30 NOTE — Telephone Encounter (Signed)
Urine culture canceled. Pt was admitted to [facility] on 11/29/21. Urine culture run on admission. Sample here discarded per Dr Meda Coffee. Call to [facility] in Microbiology at [facility] to update on canceled lab
